# Patient Record
Sex: Male | Born: 2000 | Race: White | Hispanic: No | Marital: Single | Smoking: Current some day smoker
Health system: Southern US, Community
[De-identification: ages and names within clinical notes are randomized; demographics above are authoritative.]

## PROBLEM LIST (undated history)

## (undated) DIAGNOSIS — F3181 Bipolar II disorder: Secondary | ICD-10-CM

---

## 2019-07-11 ENCOUNTER — Emergency Department (HOSPITAL_COMMUNITY): Payer: 59

## 2019-07-11 ENCOUNTER — Encounter (HOSPITAL_COMMUNITY): Payer: Self-pay | Admitting: Emergency Medicine

## 2019-07-11 ENCOUNTER — Inpatient Hospital Stay (HOSPITAL_COMMUNITY)
Admission: EM | Admit: 2019-07-11 | Discharge: 2019-07-17 | DRG: 200 | Disposition: A | Discharge status: Home / Self Care | Payer: 59 | Attending: Student | Admitting: Student

## 2019-07-11 ENCOUNTER — Other Ambulatory Visit: Payer: Self-pay

## 2019-07-11 DIAGNOSIS — F1290 Cannabis use, unspecified, uncomplicated: Secondary | ICD-10-CM | POA: Diagnosis present

## 2019-07-11 DIAGNOSIS — R002 Palpitations: Secondary | ICD-10-CM

## 2019-07-11 DIAGNOSIS — D72829 Elevated white blood cell count, unspecified: Secondary | ICD-10-CM | POA: Diagnosis present

## 2019-07-11 DIAGNOSIS — F3181 Bipolar II disorder: Secondary | ICD-10-CM | POA: Diagnosis present

## 2019-07-11 DIAGNOSIS — J9311 Primary spontaneous pneumothorax: Secondary | ICD-10-CM | POA: Diagnosis not present

## 2019-07-11 DIAGNOSIS — F17210 Nicotine dependence, cigarettes, uncomplicated: Secondary | ICD-10-CM | POA: Diagnosis present

## 2019-07-11 DIAGNOSIS — Z23 Encounter for immunization: Secondary | ICD-10-CM

## 2019-07-11 DIAGNOSIS — Z20828 Contact with and (suspected) exposure to other viral communicable diseases: Secondary | ICD-10-CM | POA: Diagnosis present

## 2019-07-11 DIAGNOSIS — Z791 Long term (current) use of non-steroidal anti-inflammatories (NSAID): Secondary | ICD-10-CM

## 2019-07-11 DIAGNOSIS — J939 Pneumothorax, unspecified: Secondary | ICD-10-CM | POA: Diagnosis not present

## 2019-07-11 HISTORY — DX: Bipolar II disorder: F31.81

## 2019-07-11 LAB — CBC
HCT: 45.7 % (ref 39.0–52.0)
HCT: 47 % (ref 39.0–52.0)
Hemoglobin: 15.6 g/dL (ref 13.0–17.0)
Hemoglobin: 16.2 g/dL (ref 13.0–17.0)
MCH: 30.4 pg (ref 26.0–34.0)
MCH: 30.1 pg (ref 26.0–34.0)
MCHC: 34.1 g/dL (ref 30.0–36.0)
MCHC: 34.5 g/dL (ref 30.0–36.0)
MCV: 88.9 fL (ref 80.0–100.0)
MCV: 87.2 fL (ref 80.0–100.0)
Platelets: 172 10*3/uL (ref 150–400)
Platelets: 205 10*3/uL (ref 150–400)
RBC: 5.14 MIL/uL (ref 4.22–5.81)
RBC: 5.39 MIL/uL (ref 4.22–5.81)
RDW: 11.9 % (ref 11.5–15.5)
RDW: 11.9 % (ref 11.5–15.5)
WBC: 10 10*3/uL (ref 4.0–10.5)
WBC: 11.7 10*3/uL — ABNORMAL HIGH (ref 4.0–10.5)
nRBC: 0 % (ref 0.0–0.2)
nRBC: 0 % (ref 0.0–0.2)

## 2019-07-11 LAB — BASIC METABOLIC PANEL
Anion gap: 8 (ref 5–15)
BUN: 11 mg/dL (ref 6–20)
CO2: 22 mmol/L (ref 22–32)
Calcium: 9.2 mg/dL (ref 8.9–10.3)
Chloride: 107 mmol/L (ref 98–111)
Creatinine, Ser: 0.9 mg/dL (ref 0.61–1.24)
GFR calc Af Amer: >60 mL/min (ref >60)
GFR calc non Af Amer: >60 mL/min (ref >60)
Glucose, Bld: 99 mg/dL (ref 70–99)
Potassium: 4.1 mmol/L (ref 3.5–5.1)
Sodium: 137 mmol/L (ref 135–145)

## 2019-07-11 LAB — CREATININE, SERUM
Creatinine, Ser: 1.03 mg/dL (ref 0.61–1.24)
GFR calc Af Amer: >60 mL/min (ref >60)
GFR calc non Af Amer: >60 mL/min (ref >60)

## 2019-07-11 LAB — TROPONIN I (HIGH SENSITIVITY)
Troponin I (High Sensitivity): <2 ng/L (ref <18)
Troponin I (High Sensitivity): <2 ng/L (ref <18)
Troponin I (High Sensitivity): <2 ng/L (ref <18)

## 2019-07-11 LAB — SEDIMENTATION RATE: Sed Rate: 1 mm/hr (ref 0–16)

## 2019-07-11 LAB — SARS CORONAVIRUS 2 (TAT 6-24 HRS): SARS Coronavirus 2: NEGATIVE

## 2019-07-11 LAB — C-REACTIVE PROTEIN: CRP: <0.8 mg/dL (ref <1.0)

## 2019-07-11 LAB — HIV ANTIBODY (ROUTINE TESTING W REFLEX): HIV Screen 4th Generation wRfx: NONREACTIVE

## 2019-07-11 MED ORDER — MORPHINE SULFATE (PF) 2 MG/ML IV SOLN
1.0000 mg | INTRAVENOUS | Status: DC | PRN
  Administered 2019-07-11 – 2019-07-13 (×5): 1 mg via INTRAVENOUS
  Filled 2019-07-11 (×5): qty 1

## 2019-07-11 MED ORDER — ONDANSETRON HCL 4 MG/2ML IJ SOLN
4.0000 mg | Freq: Four times a day (QID) | INTRAMUSCULAR | Status: DC | PRN
  Filled 2019-07-11: qty 2

## 2019-07-11 MED ORDER — ACETAMINOPHEN 325 MG PO TABS
650.0000 mg | ORAL_TABLET | Freq: Four times a day (QID) | ORAL | Status: DC | PRN
  Administered 2019-07-11 – 2019-07-15 (×3): 650 mg via ORAL
  Filled 2019-07-11 (×3): qty 2

## 2019-07-11 MED ORDER — IBUPROFEN 600 MG PO TABS: 600.0000 mg | ORAL_TABLET | Freq: Four times a day (QID) | ORAL | 0 refills | Status: AC | PRN

## 2019-07-11 MED ORDER — INFLUENZA VAC SPLIT QUAD 0.5 ML IM SUSY
0.5000 mL | PREFILLED_SYRINGE | INTRAMUSCULAR | Status: AC
  Administered 2019-07-12: 0.5 mL via INTRAMUSCULAR
  Filled 2019-07-11: qty 0.5

## 2019-07-11 MED ORDER — MIDAZOLAM HCL 2 MG/2ML IJ SOLN
2.0000 mg | Freq: Once | INTRAMUSCULAR | Status: AC
  Administered 2019-07-11: 2 mg via INTRAVENOUS
  Filled 2019-07-11: qty 2

## 2019-07-11 MED ORDER — ENOXAPARIN SODIUM 40 MG/0.4ML ~~LOC~~ SOLN
40.0000 mg | SUBCUTANEOUS | Status: DC
  Administered 2019-07-11 – 2019-07-16 (×6): 40 mg via SUBCUTANEOUS
  Filled 2019-07-11 (×6): qty 0.4

## 2019-07-11 MED ORDER — ONDANSETRON HCL 4 MG PO TABS: 4.0000 mg | ORAL_TABLET | Freq: Four times a day (QID) | ORAL | Status: DC | PRN

## 2019-07-11 MED ORDER — SODIUM CHLORIDE 0.9% FLUSH: 3.0000 mL | Freq: Once | INTRAVENOUS | Status: DC

## 2019-07-11 MED ORDER — DEXAMETHASONE SODIUM PHOSPHATE 10 MG/ML IJ SOLN
10.0000 mg | Freq: Once | INTRAMUSCULAR | Status: DC
  Filled 2019-07-11: qty 1

## 2019-07-11 MED ORDER — ACETAMINOPHEN 650 MG RE SUPP: 650.0000 mg | Freq: Four times a day (QID) | RECTAL | Status: DC | PRN

## 2019-07-11 MED ORDER — SODIUM CHLORIDE 0.9 % IV SOLN
INTRAVENOUS | Status: DC
  Administered 2019-07-11: 21:00:00 via INTRAVENOUS

## 2019-07-11 MED ORDER — KETOROLAC TROMETHAMINE 30 MG/ML IJ SOLN
30.0000 mg | Freq: Once | INTRAMUSCULAR | Status: AC
  Administered 2019-07-11: 30 mg via INTRAVENOUS
  Filled 2019-07-11: qty 1

## 2019-07-11 NOTE — Consult Note (Signed)
NAME:  Tim Flores, MRN:  [id number], DOB:  [birth year], LOS: 0 ADMISSION DATE:  07/11/2019, CONSULTATION DATE:  07/11/2019 REFERRING MD:  Dr Rhunette CroftNAnavati of ER, CHIEF COMPLAINT:  LEft primary spontaneous pneumothorax   Brief History   See below  History of present illness   18 year old male states he woke up this morning and suddenly found him to have chest pain on the left side and maybe some mild shortness of breath.  Went to the urgent care found to have moderate-sized left pneumothorax.  Brought to: ER.  There left-sided chest tube mild pigtail catheter has been placed to drainage and suction.  With this the pneumothorax is nearly resolved.  Pulmonary has been consulted by the hospitalist and the ER team to help with to proctostomy management.  His mom is at the bedside and attest that he is adopted but his biological dad is known to be tall.  Patient routinely carries 200 pounds of weight working for a [employer] company.  He is going to start work with a [employer].  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  Denies any Valsalva maneuver.  He does admit to smoking tobacco and marijuana occasionally.  He is at moderate risk for COVID-19 given the fact he is a teenager working around.  Results are pending but he denies any Covid symptoms.  Past Medical History    has a past medical history of Bipolar 2 disorder (H[facility]).   reports that he has been smoking. He does not have any smokeless tobacco history on file.  History reviewed. No pertinent surgical history.  No Known Allergies   There is no immunization history on file for this patient.  No family history on file.   Current Facility-Administered Medications:  .  dexamethasone (DECADRON) injection 10 mg, 10 mg, Intravenous, Once, Nanavati, Ankit, MD .  sodium chloride flush (NS) 0.9 % injection 3 mL, 3 mL, Intravenous, Once, Nanavati, Ankit, MD  Current Outpatient  Medications:  .  ibuprofen (ADVIL) 600 MG tablet, Take 1 tablet (600 mg total) by mouth every 6 (six) hours as needed., Disp: 30 tablet, Rfl: 0    Significant Hospital Events   07/11/2019 -left-sided chest tube for pneumothorax  Consults:  July 11, 2019-pulmonary consult  Procedures:  July 11, 2019-left-sided tube thoracostomy  Significant Diagnostic Tests:  x  Micro Data:  x  Antimicrobials:  x  Interim history/subjective:  Seen in the ER  Objective   Blood pressure 129/77, pulse 78, temperature 98.3 F (36.8 C), temperature source Oral, resp. rate (!) 22, SpO2 97 %.       No intake or output data in the 24 hours ending 07/11/19 1730 There were no vitals filed for this visit.  Examination: General: Tall young male no distress HENT: No elevated JVP no neck nodes wearing a mask Lungs: Clear to auscultation bilaterally with equal air entry.  Left-sided chest tube present Cardiovascular: Regular rate and rhythm.  Normal heart sounds Abdomen: Soft nontender no organomegaly Extremities: No cyanosis no clubbing no edema Neuro: Alert and oriented x3 GU: Not examined  Personally visualized these films and agree with the formal findings  Dg Chest 2 View  Result Date: 07/11/2019 CLINICAL DATA:  Left-sided chest pain and shortness of breath. EXAM: CHEST - 2 VIEW COMPARISON:  None. FINDINGS: The heart size and mediastinal contours are within normal limits. Normal pulmonary vascularity. Moderate to large left pneumothorax. No significant mediastinal shift.  No consolidation or pleural effusion. No acute osseous abnormality. IMPRESSION: 1. Moderate to large left pneumothorax. Critical Value/emergent results were called by telephone at the time of interpretation on 07/11/2019 at 12:49 pm to providerANKIT NANAVATI , who verbally acknowledged these results. Electronically Signed   By: Titus Dubin M.D.   On: 07/11/2019 12:51   Dg Chest Port 1 View  Result Date: 07/11/2019  CLINICAL DATA:  Chest tube placement for pneumothorax EXAM: PORTABLE CHEST 1 VIEW COMPARISON:  July 11, 2019 study obtained earlier in the day FINDINGS: A chest tube is been placed on the left laterally. There has been near complete resolution of pneumothorax on the left with only small a pickle and apicolateral component of pneumothorax present currently. No tension component. There is subcutaneous air on the left laterally. Lungs elsewhere are clear. Heart size and pulmonary vascularity are normal. No adenopathy. No bone lesions. IMPRESSION: Chest tube placed on the left with near complete resolution of pneumothorax. Small a pickle and apicolateral pneumothorax on the left remains. There is subcutaneous air on the left laterally near the chest tube. No edema or consolidation. Cardiac silhouette within normal limits. No adenopathy. Electronically Signed   By: Lowella Grip III M.D.   On: 07/11/2019 14:39     Resolved Hospital Problem list   X  Assessment & Plan:  Left-sided spontaneous primary pneumothorax status post chest tube with near resolution  Plan -Continue tube thoracostomy to suction -Reassess without suction on July 13, 2019 and then try trial of clamping before removing the chest tube. -Check urine drug screen -Await coronavirus 19 testing  Best practice:  Per hospitalist     SIGNATURE    Dr. Brand Males, M.D., F.C.C.P,  Pulmonary and Critical Care Medicine Staff Physician, [facility] [place] Director - Interstitial Lung Disease  Program  [facility] [place], [place], [address]  Pager: [phone], If no answer or between  15:00h - 7:00h: call [phone] Telephone: [phone]  5:36 PM 07/11/2019     Labs   CBC: Recent Labs  Lab 07/11/19 1106  WBC 10.0  HGB 15.6  HCT 45.7  MCV 88.9  PLT 875    Basic Metabolic Panel: Recent Labs  Lab 07/11/19 1106  NA 137  K 4.1   CL 107  CO2 22  GLUCOSE 99  BUN 11  CREATININE 0.90  CALCIUM 9.2   GFR: CrCl cannot be calculated (Unknown ideal weight.). Recent Labs  Lab 07/11/19 1106  WBC 10.0    Liver Function Tests: No results for input(s): AST, ALT, ALKPHOS, BILITOT, PROT, ALBUMIN in the last 168 hours. No results for input(s): LIPASE, AMYLASE in the last 168 hours. No results for input(s): AMMONIA in the last 168 hours.  ABG No results found for: PHART, PCO2ART, PO2ART, HCO3, TCO2, ACIDBASEDEF, O2SAT   Coagulation Profile: No results for input(s): INR, PROTIME in the last 168 hours.  Cardiac Enzymes: No results for input(s): CKTOTAL, CKMB, CKMBINDEX, TROPONINI in the last 168 hours.  HbA1C: No results found for: HGBA1C  CBG: No results for input(s): GLUCAP in the last 168 hours.  Review of Systems:   11 point review of system as per HPI otherwise negative  Past Medical History  He,  has a past medical history of Bipolar 2 disorder ([place]).   Surgical History   History reviewed. No pertinent surgical history.   Social History   reports that he has been  smoking. He does not have any smokeless tobacco history on file. He reports previous alcohol use. He reports current drug use. Drug: Marijuana.   Family History   His family history is not on file.   Allergies No Known Allergies   Home Medications  Prior to Admission medications   Medication Sig Start Date End Date Taking? Authorizing Provider  ibuprofen (ADVIL) 600 MG tablet Take 1 tablet (600 mg total) by mouth every 6 (six) hours as needed. 07/11/19   Derwood Kaplan, MD

## 2019-07-11 NOTE — H&P (Signed)
TRH H&P    Patient Demographics:    Tim Flores, is a 18 y.o. male  MRN: [id number]  DOB - December 31, 2000  Admit Date - 07/11/2019  Referring MD/NP/PA: Dr. Kathrynn Humble  Outpatient Primary MD for the patient is Patient, No Pcp Per  Patient coming from: Home  Chief complaint-chest pain   HPI:    Tim Flores  is a 18 y.o. male, with history of bipolar disorder came with complaints of left-sided chest pain and palpitations which started this morning.  Patient says that symptoms started after he woke up the pain was left-sided throbbing with palpitations.  He was found to have abnormal EKG, with diffuse ST elevation.  In the ED patient was found to have moderate to large left-sided pneumothorax.  Chest tube was inserted.  Repeat x-ray shows resolution of pneumothorax with chest tube in place.  Pulmonology was consulted. Patient also told ED provider about 2-3 loose BMs so he has been placed under PUI to rule out COVID-19. He denies nausea and vomiting. He smokes 4 to 5 cigarettes a day. He denies drinking alcohol.    Review of systems:    In addition to the HPI above,   All other systems reviewed and are negative.    Past History of the following :    Past Medical History:  Diagnosis Date  . Bipolar 2 disorder ([place])       History reviewed. No pertinent surgical history.    Social History:   e   Social History   Tobacco Use  . Smoking status: Current Some Day Smoker  Substance Use Topics  . Alcohol use: Not Currently    Frequency: Never       Family History :   No family history of cancer or heart disease   Home Medications:   Prior to Admission medications   Medication Sig Start Date End Date Taking? Authorizing Provider  ibuprofen (ADVIL) 600 MG tablet Take 1 tablet (600 mg total) by mouth every 6 (six) hours as needed. 07/11/19   Varney Biles, MD     Allergies:    No Known  Allergies   Physical Exam:   Vitals  Blood pressure 129/72, pulse (!) 46, temperature 98.3 F (36.8 C), temperature source Oral, resp. rate 15, SpO2 99 %.  1.  General: Appears in no acute distress  2. Psychiatric: Alert, oriented x3, intact insight and judgment  3. Neurologic: Cranial nerves II through XII grossly intact, moving all extremities  4. HEENMT:  Atraumatic normocephalic, extraocular muscles are intact  5. Respiratory : Clear to auscultation bilaterally, no wheezing or crackles  6. Cardiovascular : S1-S2, regular, no murmur auscultated  7. Gastrointestinal:  Abdomen is soft, nontender, no organomegaly      Data Review:    CBC Recent Labs  Lab 07/11/19 1106  WBC 10.0  HGB 15.6  HCT 45.7  PLT 172  MCV 88.9  MCH 30.4  MCHC 34.1  RDW 11.9   ------------------------------------------------------------------------------------------------------------------  Results for orders placed or performed during the hospital encounter of 07/11/19 (from  the past 48 hour(s))  Basic metabolic panel     Status: None   Collection Time: 07/11/19 11:06 AM  Result Value Ref Range   Sodium 137 135 - 145 mmol/L   Potassium 4.1 3.5 - 5.1 mmol/L   Chloride 107 98 - 111 mmol/L   CO2 22 22 - 32 mmol/L   Glucose, Bld 99 70 - 99 mg/dL   BUN 11 6 - 20 mg/dL   Creatinine, Ser 0.90 0.61 - 1.24 mg/dL   Calcium 9.2 8.9 - 10.3 mg/dL   GFR calc non Af Amer >60 >60 mL/min   GFR calc Af Amer >60 >60 mL/min   Anion gap 8 5 - 15    Comment: Performed at [facility], [place] [address], [place] [address]  CBC     Status: None   Collection Time: 07/11/19 11:06 AM  Result Value Ref Range   WBC 10.0 4.0 - 10.5 K/uL   RBC 5.14 4.22 - 5.81 MIL/uL   Hemoglobin 15.6 13.0 - 17.0 g/dL   HCT 45.7 39.0 - 52.0 %   MCV 88.9 80.0 - 100.0 fL   MCH 30.4 26.0 - 34.0 pg   MCHC 34.1 30.0 - 36.0 g/dL   RDW 11.9 11.5 - 15.5 %   Platelets 172 150 - 400 K/uL   nRBC 0.0 0.0 - 0.2 %     Comment: Performed at [facility], [place] [address] [place], [place] [address]  Troponin I (High Sensitivity)     Status: None   Collection Time: 07/11/19 11:06 AM  Result Value Ref Range   Troponin I (High Sensitivity) <2 <18 ng/L    Comment: (NOTE) Elevated high sensitivity troponin I (hsTnI) values and significant  changes across serial measurements may suggest ACS but many other  chronic and acute conditions are known to elevate hsTnI results.  Refer to the Links section for chest pain algorithms and additional  guidance. Performed at [facility], [place] [address] [place], [place] [address]     Chemistries  Recent Labs  Lab 07/11/19 1106  NA 137  K 4.1  CL 107  CO2 22  GLUCOSE 99  BUN 11  CREATININE 0.90  CALCIUM 9.2   -- Urine analysis: No results found for: COLORURINE, APPEARANCEUR, LABSPEC, PHURINE, GLUCOSEU, HGBUR, BILIRUBINUR, KETONESUR, PROTEINUR, UROBILINOGEN, NITRITE, LEUKOCYTESUR    Imaging Results:    Dg Chest 2 View  Result Date: 07/11/2019 CLINICAL DATA:  Left-sided chest pain and shortness of breath. EXAM: CHEST - 2 VIEW COMPARISON:  None. FINDINGS: The heart size and mediastinal contours are within normal limits. Normal pulmonary vascularity. Moderate to large left pneumothorax. No significant mediastinal shift. No consolidation or pleural effusion. No acute osseous abnormality. IMPRESSION: 1. Moderate to large left pneumothorax. Critical Value/emergent results were called by telephone at the time of interpretation on 07/11/2019 at 12:49 pm to providerANKIT NANAVATI , who verbally acknowledged these results. Electronically Signed   By: Titus Dubin M.D.   On: 07/11/2019 12:51   Dg Chest Port 1 View  Result Date: 07/11/2019 CLINICAL DATA:  Chest tube placement for pneumothorax EXAM: PORTABLE CHEST 1 VIEW COMPARISON:  July 11, 2019 study obtained earlier in the day FINDINGS: A chest tube is been placed on the left laterally. There has  been near complete resolution of pneumothorax on the left with only small a pickle and apicolateral component of pneumothorax present currently. No tension component. There is subcutaneous air on the left laterally.  Lungs elsewhere are clear. Heart size and pulmonary vascularity are normal. No adenopathy. No bone lesions. IMPRESSION: Chest tube placed on the left with near complete resolution of pneumothorax. Small a pickle and apicolateral pneumothorax on the left remains. There is subcutaneous air on the left laterally near the chest tube. No edema or consolidation. Cardiac silhouette within normal limits. No adenopathy. Electronically Signed   By: Lowella Grip III M.D.   On: 07/11/2019 14:39    My personal review of EKG: Rhythm NSR, diffuse ST elevation noted in almost all leads.   Assessment & Plan:    Active Problems:   Pneumothorax   1. Pneumothorax-patient presented with left-sided pneumothorax, status post chest tube placement.  Chest x-ray after chest tube placement shows near complete resolution of pneumothorax.  Continue with chest tube, will obtain chest x-ray in a.m.  Pulmonology has been consulted.  Start morphine 1 mg every 4 hours as needed for pain.  2. ?  Pericarditis-patient has diffuse ST elevation noted on the EKG.  Also came with left-sided chest pain though he had pneumothorax as above.  Concern for myopericarditis with COVID-19.  Patient will be admitted for PUI to rule out COVID-19.  Repeat EKG in a.m.  Will cycle troponin x2.  Obtain echocardiogram, ESR, CRP, HIV.  Follow echocardiogram results.    DVT Prophylaxis-   Lovenox   AM Labs Ordered, also please review Full Orders  Family Communication: Admission, patients condition and plan of care including tests being ordered have been discussed with the patient who indicate understanding and agree with the plan and Code Status.  Code Status: Full code  Admission status: Observation: Based on patients clinical  presentation and evaluation of above clinical data, I have made determination that patient meets Inpatient criteria at this time.  Time spent in minutes : 60 minutes   Oswald Hillock M.D on 07/11/2019 at 3:16 PM

## 2019-07-11 NOTE — ED Provider Notes (Addendum)
[name] Provider Note   CSN: [id number] Arrival date & time: 07/11/19  1053     History   Chief Complaint Chief Complaint  Patient presents with   Chest Pain    HPI Tim Flores is a 18 y.o. male.     HPI  18 year old with history of bipolar disorder comes with a chief complaint of chest pain, palpitations.  He reports that he started having symptoms around 9:00 after he woke up.  The pain initially was excruciating and described as left-sided throbbing pain with palpitations.  He eventually admitted to the urgent care where the noticed abnormal EKG and sent him to the ER.  Patient states that his pain has improved significantly but at no point has it resolved.  His pain is worse with deep inspiration.  He does not think the pain is positional or exertional.  No history of similar pain.  He reports regular marijuana use.  Review of system is also positive for diarrheal illness for the last 1 week.  He is having 2-3 loose bowel once every day.  He denies any sick exposures.  No chest pain, cough.  Past Medical History:  Diagnosis Date   Bipolar 2 disorder (HCC)     There are no active problems to display for this patient.   History reviewed. No pertinent surgical history.      Home Medications    Prior to Admission medications   Medication Sig Start Date End Date Taking? Authorizing Provider  ibuprofen (ADVIL) 600 MG tablet Take 1 tablet (600 mg total) by mouth every 6 (six) hours as needed. 07/11/19   Derwood KaplanNanavati, Tichina Koebel, MD    Family History No family history on file.  Social History Social History   Tobacco Use   Smoking status: Current Some Day Smoker  Substance Use Topics   Alcohol use: Not Currently    Frequency: Never   Drug use: Yes    Types: Marijuana     Allergies   Patient has no known allergies.   Review of Systems Review of Systems  Constitutional: Positive for activity change.  Respiratory:  Positive for shortness of breath. Negative for cough.   Cardiovascular: Positive for chest pain.  Gastrointestinal: Positive for diarrhea.  Allergic/Immunologic: Negative for immunocompromised state.  All other systems reviewed and are negative.    Physical Exam Updated Vital Signs BP 129/60    Pulse 65    Temp 98.3 F (36.8 C) (Oral)    Resp 20    SpO2 98%   Physical Exam Vitals signs and nursing note reviewed.  Constitutional:      Appearance: He is well-developed.  HENT:     Head: Atraumatic.  Neck:     Musculoskeletal: Neck supple.  Cardiovascular:     Rate and Rhythm: Normal rate.     Heart sounds: Normal heart sounds. No murmur. No friction rub.  Pulmonary:     Effort: Pulmonary effort is normal.  Musculoskeletal:     Right lower leg: No edema.     Left lower leg: No edema.  Skin:    General: Skin is warm.  Neurological:     Mental Status: He is alert and oriented to person, place, and time.      ED Treatments / Results  Labs (all labs ordered are listed, but only abnormal results are displayed) Labs Reviewed  SARS CORONAVIRUS 2 (TAT 6-24 HRS)  BASIC METABOLIC PANEL  CBC  TROPONIN I (HIGH SENSITIVITY)  TROPONIN  I (HIGH SENSITIVITY)    EKG EKG Interpretation  Date/Time:  Saturday July 11 2019 10:58:17 EDT Ventricular Rate:  52 PR Interval:  118 QRS Duration: 100 QT Interval:  408 QTC Calculation: 379 R Axis:   81 Text Interpretation:  Sinus bradycardia with sinus arrhythmia ST elevation, consider early repolarization, pericarditis, or injury Abnormal ECG No old tracing to compare Confirmed by Varney Biles [phone]) on 07/11/2019 11:37:54 AM   Radiology Dg Chest 2 View  Result Date: 07/11/2019 CLINICAL DATA:  Left-sided chest pain and shortness of breath. EXAM: CHEST - 2 VIEW COMPARISON:  None. FINDINGS: The heart size and mediastinal contours are within normal limits. Normal pulmonary vascularity. Moderate to large left pneumothorax. No  significant mediastinal shift. No consolidation or pleural effusion. No acute osseous abnormality. IMPRESSION: 1. Moderate to large left pneumothorax. Critical Value/emergent results were called by telephone at the time of interpretation on 07/11/2019 at 12:49 pm to providerANKIT Armeda Plumb , who verbally acknowledged these results. Electronically Signed   By: Titus Dubin M.D.   On: 07/11/2019 12:51    Procedures .Critical Care Performed by: Varney Biles, MD Authorized by: Varney Biles, MD   Critical care provider statement:    Critical care time (minutes):  33   Critical care was necessary to treat or prevent imminent or life-threatening deterioration of the following conditions:  Respiratory failure   Critical care was time spent personally by me on the following activities:  Discussions with consultants, evaluation of patient's response to treatment, examination of patient, ordering and performing treatments and interventions, ordering and review of laboratory studies, ordering and review of radiographic studies, pulse oximetry, re-evaluation of patient's condition, obtaining history from patient or surrogate and review of old charts CHEST TUBE INSERTION  Date/Time: 07/11/2019 1:56 PM Performed by: Varney Biles, MD Authorized by: Varney Biles, MD   Consent:    Consent obtained:  Written   Consent given by:  Patient   Risks discussed:  Bleeding, incomplete drainage, nerve damage, damage to surrounding structures, infection and pain   Alternatives discussed:  No treatment Universal protocol:    Procedure explained and questions answered to patient or proxy's satisfaction: yes     Relevant documents present and verified: yes     Test results available and properly labeled: yes     Imaging studies available: yes     Required blood products, implants, devices, and special equipment available: yes     Site/side marked: yes     Immediately prior to procedure a time out was  called: yes     Patient identity confirmed:  Arm band Pre-procedure details:    Skin preparation:  ChloraPrep   Preparation: Patient was prepped and draped in the usual sterile fashion   Sedation:    Sedation type:  Anxiolysis Anesthesia (see MAR for exact dosages):    Anesthesia method:  Local infiltration   Local anesthetic:  Lidocaine 1% WITH epi Procedure details:    Placement location:  L lateral   Scalpel size:  10   Tube size (Fr):  16   Ultrasound guidance: no     Tension pneumothorax: no     Tube connected to:  Suction   Drainage characteristics:  Air only   Suture material:  0 silk   Dressing:  4x4 sterile gauze Post-procedure details:    Post-insertion x-ray findings: tube in good position     Patient tolerance of procedure:  Tolerated well, no immediate complications   (including critical care time)  Medications  Ordered in ED Medications  sodium chloride flush (NS) 0.9 % injection 3 mL (3 mLs Intravenous Not Given 07/11/19 1156)  dexamethasone (DECADRON) injection 10 mg (has no administration in time range)  midazolam (VERSED) injection 2 mg (has no administration in time range)     Initial Impression / Assessment and Plan / ED Course  I have reviewed the triage vital signs and the nursing notes.  Pertinent labs & imaging results that were available during my care of the patient were reviewed by me and considered in my medical decision making (see chart for details).  Clinical Course as of Jul 10 1301  Sat Jul 11, 2019  1240 ACS is not high in the differential diagnosis.  Repeat troponin will not be ordered.  Symptoms have been ongoing for 3 hours at the time of draw, therefore if there was to be myocardial injury troponin would have been detected by now.  Troponin I (High Sensitivity): <2 [AN]  1301 Chest x-ray shows large left-sided pneumothorax Patient has been consented.  Results of the x-ray discussed with him.  We will proceed with tube thoracostomy for  primary spontaneous pneumothorax.  DG Chest 2 View [AN]    Clinical Course User Index [AN] Derwood Kaplan, MD       18 year old comes in a chief complaint of abnormal EKG and chest pain.  Chest pain started at 9:00 and has been constant.  He denies any exertional component but there is pleuritic component to it.  Differential diagnosis includes pericarditis, myocarditis, tachydysrhythmia like PSVT.  It is possible that marijuana could have been laced.  We will get a UDS along with basic labs.  Of note, patient also reports diarrheal illness for 1 week.  COVID-19 is known to cause diarrhea and also myopericarditis.  His EKG is showing likely pericarditis so we will get an outpatient Covid test as well.   PUI instructions discussed.  Patient lives with a roommate.  He will remain isolated until his test results come back.  Tim Flores was evaluated in Emergency Department on 07/11/2019 for the symptoms described in the history of present illness. He was evaluated in the context of the global COVID-19 pandemic, which necessitated consideration that the patient might be at risk for infection with the SARS-CoV-2 virus that causes COVID-19. Institutional protocols and algorithms that pertain to the evaluation of patients at risk for COVID-19 are in a state of rapid change based on information released by regulatory bodies including the CDC and federal and state organizations. These policies and algorithms were followed during the patient's care in the ED.    Final Clinical Impressions(s) / ED Diagnoses   Final diagnoses:  Palpitations  Primary spontaneous pneumothorax    ED Discharge Orders         Ordered    ibuprofen (ADVIL) 600 MG tablet  Every 6 hours PRN     07/11/19 1239           Derwood Kaplan, MD 07/11/19 1241    Derwood Kaplan, MD 07/11/19 1302    Derwood Kaplan, MD 07/11/19 1357

## 2019-07-11 NOTE — ED Triage Notes (Signed)
Pt arrives via [facility] from home with c/o sharp and pressure like chest pain with palpitations that began yesterday, symptoms increased today. Pt seen at [facility] and was found to be in and out of an irregular rhythm on 12 lead there with some ST elevation that was transmitted to EDP here pta. Pt received 324mg  asa, pt endorses hx of marijuana use but denies any other illicit drugs. A/ox4. No known cardiac history.  Vss: 144/44, HR 40-70s, 99% on RA, CBG 114.

## 2019-07-11 NOTE — Plan of Care (Signed)

## 2019-07-11 NOTE — Discharge Instructions (Addendum)
Pneumothorax °A pneumothorax is commonly called a collapsed lung. It is a condition in which air leaks from a lung and builds up between the thin layer of tissue that covers the lungs (visceral pleura) and the interior wall of the chest cavity (parietal pleura). The air gets trapped outside the lung, between the lung and the chest wall (pleural space). The air takes up space and prevents the lung from fully expanding. °This condition sometimes occurs suddenly with no apparent cause. The buildup of air may be small or large. A small pneumothorax may go away on its own. A large pneumothorax will require treatment and hospitalization. °What are the causes? °This condition may be caused by: °· Trauma and injury to the chest wall. °· Surgery and other medical procedures. °· A complication of an underlying lung problem, especially chronic obstructive pulmonary disease (COPD) or emphysema. °Sometimes the cause of this condition is not known. °What increases the risk? °You are more likely to develop this condition if: °· You have an underlying lung problem. °· You smoke. °· You are 20-40 years old, male, tall, and underweight. °· You have a personal or family history of pneumothorax. °· You have an eating disorder (anorexia nervosa). °This condition can also happen quickly, even in people with no history of lung problems. °What are the signs or symptoms? °Sometimes a pneumothorax will have no symptoms. When symptoms are present, they can include: °· Chest pain. °· Shortness of breath. °· Increased rate of breathing. °· Bluish color to your lips or skin (cyanosis). °How is this diagnosed? °This condition may be diagnosed by: °· A medical history and physical exam. °· A chest X-ray, chest CT scan, or ultrasound. °How is this treated? °Treatment depends on how severe your condition is. The goal of treatment is to remove the extra air and allow your lung to expand back to its normal size. °· For a small pneumothorax: °? No  treatment may be needed. °? Extra oxygen is sometimes used to make it go away more quickly. °· For a large pneumothorax or a pneumothorax that is causing symptoms, a procedure is done to drain the air from your lungs. To do this, a health care provider may use: °? A needle with a syringe. This is used to suck air from a pleural space where no additional leakage is taking place. °? A chest tube. This is used to suck air where there is ongoing leakage into the pleural space. The chest tube may need to remain in place for several days until the air leak has healed. °· In more severe cases, surgery may be needed to repair the damage that is causing the leak. °· If you have multiple pneumothorax episodes or have an air leak that will not heal, a procedure called a pleurodesis may be done. A medicine is placed in the pleural space to irritate the tissues around the lung so that the lung will stick to the chest wall, seal any leaks, and stop any buildup of air in that space. °If you have an underlying lung problem, severe symptoms, or a large pneumothorax you will usually need to stay in the hospital. °Follow these instructions at home: °Lifestyle °· Do not use any products that contain nicotine or tobacco, such as cigarettes and e-cigarettes. These are major risk factors in pneumothorax. If you need help quitting, ask your health care provider. °· Do not lift anything that is heavier than 10 lb (4.5 kg), or the limit that your health care   provider tells you, until he or she says that it is safe. °· Avoid activities that take a lot of effort (strenuous) for as long as told by your health care provider. °· Return to your normal activities as told by your health care provider. Ask your health care provider what activities are safe for you. °· Do not fly in an airplane or scuba dive until your health care provider says it is okay. °General instructions °· Take over-the-counter and prescription medicines only as told by your  health care provider. °· If a cough or pain makes it difficult for you to sleep at night, try sleeping in a semi-upright position in a recliner or by using 2 or 3 pillows. °· If you had a chest tube and it was removed, ask your health care provider when you can remove the bandage (dressing). While the dressing is in place, do not allow it to get wet. °· Keep all follow-up visits as told by your health care provider. This is important. °Contact a health care provider if: °· You cough up thick mucus (sputum) that is yellow or green in color. °· You were treated with a chest tube, and you have redness, increasing pain, or discharge at the site where it was placed. °Get help right away if: °· You have increasing chest pain or shortness of breath. °· You have a cough that will not go away. °· You begin coughing up blood. °· You have pain that is getting worse or is not controlled with medicines. °· The site where your chest tube was located opens up. °· You feel air coming out of the site where the chest tube was placed. °· You have a fever or persistent symptoms for more than 2-3 days. °· You have a fever and your symptoms suddenly get worse. °These symptoms may represent a serious problem that is an emergency. Do not wait to see if the symptoms will go away. Get medical help right away. Call your local emergency services (911 in the [place] Do not drive yourself to the hospital. °Summary °· A pneumothorax, commonly called a collapsed lung, is a condition in which air leaks from a lung and gets trapped between the lung and the chest wall (pleural space). °· The buildup of air may be small or large. A small pneumothorax may go away on its own. A large pneumothorax will require treatment and hospitalization. °· Treatment for this condition depends on how severe the pneumothorax is. The goal of treatment is to remove the extra air and allow the lung to expand back to its normal size. °This information is not intended to  replace advice given to you by your health care provider. Make sure you discuss any questions you have with your health care provider. °Document Released: 09/03/2005 Document Revised: 08/16/2017 Document Reviewed: 08/12/2017 °Elsevier Patient Education © 2020 [employer] ° °

## 2019-07-12 ENCOUNTER — Observation Stay (HOSPITAL_COMMUNITY): Payer: 59

## 2019-07-12 DIAGNOSIS — F1990 Other psychoactive substance use, unspecified, uncomplicated: Secondary | ICD-10-CM | POA: Diagnosis not present

## 2019-07-12 DIAGNOSIS — F1920 Other psychoactive substance dependence, uncomplicated: Secondary | ICD-10-CM | POA: Diagnosis not present

## 2019-07-12 DIAGNOSIS — F17200 Nicotine dependence, unspecified, uncomplicated: Secondary | ICD-10-CM | POA: Diagnosis not present

## 2019-07-12 DIAGNOSIS — D72829 Elevated white blood cell count, unspecified: Secondary | ICD-10-CM | POA: Diagnosis present

## 2019-07-12 DIAGNOSIS — F1290 Cannabis use, unspecified, uncomplicated: Secondary | ICD-10-CM | POA: Diagnosis present

## 2019-07-12 DIAGNOSIS — F17210 Nicotine dependence, cigarettes, uncomplicated: Secondary | ICD-10-CM | POA: Diagnosis present

## 2019-07-12 DIAGNOSIS — Z23 Encounter for immunization: Secondary | ICD-10-CM | POA: Diagnosis not present

## 2019-07-12 DIAGNOSIS — J9311 Primary spontaneous pneumothorax: Secondary | ICD-10-CM | POA: Diagnosis present

## 2019-07-12 DIAGNOSIS — Z791 Long term (current) use of non-steroidal anti-inflammatories (NSAID): Secondary | ICD-10-CM | POA: Diagnosis not present

## 2019-07-12 DIAGNOSIS — Z720 Tobacco use: Secondary | ICD-10-CM | POA: Diagnosis not present

## 2019-07-12 DIAGNOSIS — I313 Pericardial effusion (noninflammatory): Secondary | ICD-10-CM

## 2019-07-12 DIAGNOSIS — Z20828 Contact with and (suspected) exposure to other viral communicable diseases: Secondary | ICD-10-CM | POA: Diagnosis present

## 2019-07-12 DIAGNOSIS — F3181 Bipolar II disorder: Secondary | ICD-10-CM | POA: Diagnosis present

## 2019-07-12 DIAGNOSIS — F1120 Opioid dependence, uncomplicated: Secondary | ICD-10-CM | POA: Diagnosis not present

## 2019-07-12 DIAGNOSIS — R002 Palpitations: Secondary | ICD-10-CM | POA: Diagnosis present

## 2019-07-12 LAB — COMPREHENSIVE METABOLIC PANEL
ALT: 13 U/L (ref 0–44)
AST: 13 U/L — ABNORMAL LOW (ref 15–41)
Albumin: 4 g/dL (ref 3.5–5.0)
Alkaline Phosphatase: 55 U/L (ref 38–126)
Anion gap: 10 (ref 5–15)
BUN: 12 mg/dL (ref 6–20)
CO2: 23 mmol/L (ref 22–32)
Calcium: 9.5 mg/dL (ref 8.9–10.3)
Chloride: 107 mmol/L (ref 98–111)
Creatinine, Ser: 0.86 mg/dL (ref 0.61–1.24)
GFR calc Af Amer: >60 mL/min (ref >60)
GFR calc non Af Amer: >60 mL/min (ref >60)
Glucose, Bld: 87 mg/dL (ref 70–99)
Potassium: 3.9 mmol/L (ref 3.5–5.1)
Sodium: 140 mmol/L (ref 135–145)
Total Bilirubin: 2.8 mg/dL — ABNORMAL HIGH (ref 0.3–1.2)
Total Protein: 6.3 g/dL — ABNORMAL LOW (ref 6.5–8.1)

## 2019-07-12 LAB — RAPID URINE DRUG SCREEN, HOSP PERFORMED
Amphetamines: NOT DETECTED
Barbiturates: NOT DETECTED
Benzodiazepines: POSITIVE — AB
Cocaine: NOT DETECTED
Opiates: POSITIVE — AB
Tetrahydrocannabinol: POSITIVE — AB

## 2019-07-12 LAB — ECHOCARDIOGRAM COMPLETE
Height: 74 in
Weight: 2400 oz

## 2019-07-12 LAB — CBC
HCT: 44.4 % (ref 39.0–52.0)
Hemoglobin: 15.6 g/dL (ref 13.0–17.0)
MCH: 30.8 pg (ref 26.0–34.0)
MCHC: 35.1 g/dL (ref 30.0–36.0)
MCV: 87.7 fL (ref 80.0–100.0)
Platelets: 186 10*3/uL (ref 150–400)
RBC: 5.06 MIL/uL (ref 4.22–5.81)
RDW: 11.9 % (ref 11.5–15.5)
WBC: 8.7 10*3/uL (ref 4.0–10.5)
nRBC: 0 % (ref 0.0–0.2)

## 2019-07-12 LAB — TROPONIN I (HIGH SENSITIVITY): Troponin I (High Sensitivity): 4 ng/L (ref <18)

## 2019-07-12 LAB — SARS CORONAVIRUS 2 (TAT 6-24 HRS): SARS Coronavirus 2: NEGATIVE

## 2019-07-12 NOTE — Progress Notes (Addendum)
Chest x-ray still revealing pneumothorax   Will place chest tube back to suction Follow-up on chest x-ray in a.m.

## 2019-07-12 NOTE — Progress Notes (Signed)
  Echocardiogram 2D Echocardiogram has been performed.  Tim Flores 07/12/2019, 2:40 PM

## 2019-07-12 NOTE — Progress Notes (Signed)
Patient ID: Tim CruelMatthew Silveira, male   DOB: [birth year], 18 y.o.   MRN: [id number]  PROGRESS NOTE    Tim Flores  WUJ:[id number] DOB: [birth year] DOA: 07/11/2019 PCP: Patient, No Pcp Per   Brief Narrative:  18-year-old male with history of bipolar disorder presented on 07/11/2019 with left-sided chest pain and palpitations.  He was found to have abnormal EKG with diffuse ST elevation along with moderate to large left-sided pneumothorax.  Chest tube was inserted.  Pulmonary was consulted.  Assessment & Plan:   Spontaneous left-sided pneumothorax -Status post left-sided chest tube placement resulting in much improvement. -Pulmonary following and managing chest.  Follow chest x-rays -COVID-19 testing negative  Abnormal EKG: Resolved -Initial EKG showed question of diffuse ST elevation with concern for Covid myopericarditis -This morning's EKG only showed sinus bradycardia. -No current chest pains.  Leukocytosis -Probably reactive.  Resolved.   DVT prophylaxis: Lovenox Code Status: Full Family Communication: Spoke to patient at bedside.  No family at bedside. Disposition Plan: Home in 1 to 2 days once cleared by pulmonary  Consultants: Pulmonary  Procedures: Chest tube placement  Antimicrobials: None   Subjective: Patient seen and examined at bedside.  He denies any current chest pain.  Only some soreness around the chest tube site.  No difficulty breathing.  No cough.  No overnight fevers.  Objective: Vitals:   07/12/19 0000 07/12/19 0500 07/12/19 0508 07/12/19 0813  BP: 111/66 105/64  127/62  Pulse:    63  Resp: 18   18  Temp: 98.6 F (37 C)  98.2 F (36.8 C) 98 F (36.7 C)  TempSrc: Oral  Oral Oral  SpO2: 99% 100%  99%  Weight:      Height:        Intake/Output Summary (Last 24 hours) at 07/12/2019 0959 Last data filed at 07/11/2019 2100 Gross per 24 hour  Intake 3.83 ml  Output -  Net 3.83 ml   Filed Weights   07/11/19 2046  Weight: 68 kg    Examination:  General exam: Appears calm and comfortable  Respiratory system: Bilateral decreased breath sounds at bases with left-sided chest tube present Cardiovascular system: S1 & S2 heard, Rate controlled Gastrointestinal system: Abdomen is nondistended, soft and nontender. Normal bowel sounds heard. Extremities: No cyanosis, clubbing, edema    Data Reviewed: I have personally reviewed following labs and imaging studies  CBC: Recent Labs  Lab 07/11/19 1106 07/11/19 2025 07/12/19 0502  WBC 10.0 11.7* 8.7  HGB 15.6 16.2 15.6  HCT 45.7 47.0 44.4  MCV 88.9 87.2 87.7  PLT 172 205 186   Basic Metabolic Panel: Recent Labs  Lab 07/11/19 1106 07/11/19 2025 07/12/19 0502  NA 137  --  140  K 4.1  --  3.9  CL 107  --  107  CO2 22  --  23  GLUCOSE 99  --  87  BUN 11  --  12  CREATININE 0.90 1.03 0.86  CALCIUM 9.2  --  9.5   GFR: Estimated Creatinine Clearance: 132.9 mL/min (by C-G formula based on SCr of 0.86 mg/dL). Liver Function Tests: Recent Labs  Lab 07/12/19 0502  AST 13*  ALT 13  ALKPHOS 55  BILITOT 2.8*  PROT 6.3*  ALBUMIN 4.0   No results for input(s): LIPASE, AMYLASE in the last 168 hours. No results for input(s): AMMONIA in the last 168 hours. Coagulation Profile: No results for input(s): INR, PROTIME in the last 168 hours. Cardiac Enzymes: No results for input(s): CKTOTAL, CKMB, CKMBINDEX,  TROPONINI in the last 168 hours. BNP (last 3 results) No results for input(s): PROBNP in the last 8760 hours. HbA1C: No results for input(s): HGBA1C in the last 72 hours. CBG: No results for input(s): GLUCAP in the last 168 hours. Lipid Profile: No results for input(s): CHOL, HDL, LDLCALC, TRIG, CHOLHDL, LDLDIRECT in the last 72 hours. Thyroid Function Tests: No results for input(s): TSH, T4TOTAL, FREET4, T3FREE, THYROIDAB in the last 72 hours. Anemia Panel: No results for input(s): VITAMINB12, FOLATE, FERRITIN, TIBC, IRON, RETICCTPCT in the last 72 hours. Sepsis Labs: No  results for input(s): PROCALCITON, LATICACIDVEN in the last 168 hours.  Recent Results (from the past 240 hour(s))  SARS CORONAVIRUS 2 (TAT 6-24 HRS) Nasopharyngeal Nasopharyngeal Swab     Status: None   Collection Time: 07/11/19  1:01 PM   Specimen: Nasopharyngeal Swab  Result Value Ref Range Status   SARS Coronavirus 2 NEGATIVE NEGATIVE Final    Comment: (NOTE) SARS-CoV-2 target nucleic acids are NOT DETECTED. The SARS-CoV-2 RNA is generally detectable in upper and lower respiratory specimens during the acute phase of infection. Negative results do not preclude SARS-CoV-2 infection, do not rule out co-infections with other pathogens, and should not be used as the sole basis for treatment or other patient management decisions. Negative results must be combined with clinical observations, patient history, and epidemiological information. The expected result is Negative. Fact Sheet for Patients: HairSlick.no Fact Sheet for Healthcare Providers: quierodirigir.com This test is not yet approved or cleared by the [place] FDA and  has been authorized for detection and/or diagnosis of SARS-CoV-2 by FDA under an Emergency Use Authorization (EUA). This EUA will remain  in effect (meaning this test can be used) for the duration of the COVID-19 declaration under Section 56 4(b)(1) of the Act, 21 U.S.C. section 360bbb-3(b)(1), unless the authorization is terminated or revoked sooner. Performed at [facility], [address] [place], [place] [address]   SARS CORONAVIRUS 2 (TAT 6-24 HRS) Nasopharyngeal Nasopharyngeal Swab     Status: None   Collection Time: 07/11/19 11:51 PM   Specimen: Nasopharyngeal Swab  Result Value Ref Range Status   SARS Coronavirus 2 NEGATIVE NEGATIVE Final    Comment: (NOTE) SARS-CoV-2 target nucleic acids are NOT DETECTED. The SARS-CoV-2 RNA is generally detectable in upper and lower respiratory  specimens during the acute phase of infection. Negative results do not preclude SARS-CoV-2 infection, do not rule out co-infections with other pathogens, and should not be used as the sole basis for treatment or other patient management decisions. Negative results must be combined with clinical observations, patient history, and epidemiological information. The expected result is Negative. Fact Sheet for Patients: HairSlick.no Fact Sheet for Healthcare Providers: quierodirigir.com This test is not yet approved or cleared by the [place] FDA and  has been authorized for detection and/or diagnosis of SARS-CoV-2 by FDA under an Emergency Use Authorization (EUA). This EUA will remain  in effect (meaning this test can be used) for the duration of the COVID-19 declaration under Section 56 4(b)(1) of the Act, 21 U.S.C. section 360bbb-3(b)(1), unless the authorization is terminated or revoked sooner. Performed at [facility], [address], [place], [place] [address]          Radiology Studies: Dg Chest 2 View  Result Date: 07/11/2019 CLINICAL DATA:  Left-sided chest pain and shortness of breath. EXAM: CHEST - 2 VIEW COMPARISON:  None. FINDINGS: The heart size and mediastinal contours are within normal limits. Normal pulmonary  vascularity. Moderate to large left pneumothorax. No significant mediastinal shift. No consolidation or pleural effusion. No acute osseous abnormality. IMPRESSION: 1. Moderate to large left pneumothorax. Critical Value/emergent results were called by telephone at the time of interpretation on 07/11/2019 at 12:49 pm to providerANKIT NANAVATI , who verbally acknowledged these results. Electronically Signed   By: Titus Dubin M.D.   On: 07/11/2019 12:51   Dg Chest Port 1 View  Result Date: 07/12/2019 CLINICAL DATA:  Left-sided pneumothorax EXAM: PORTABLE CHEST 1 VIEW COMPARISON:  July 11, 2019  FINDINGS: Chest tube remains on the left laterally. Left apical pneumothorax is slightly larger without tension component. There is less soft tissue air laterally on the left compared to 1 day prior. Elsewhere lungs are clear. Heart size and pulmonary vascularity are normal. No adenopathy. No bone lesions. IMPRESSION: Left a pickle pneumothorax slightly larger compared to 1 day prior with left chest tube again noted present. No tension component. No edema or consolidation. Cardiac silhouette within normal limits. Electronically Signed   By: Lowella Grip III M.D.   On: 07/12/2019 08:51   Dg Chest Port 1 View  Result Date: 07/11/2019 CLINICAL DATA:  Chest tube placement for pneumothorax EXAM: PORTABLE CHEST 1 VIEW COMPARISON:  July 11, 2019 study obtained earlier in the day FINDINGS: A chest tube is been placed on the left laterally. There has been near complete resolution of pneumothorax on the left with only small a pickle and apicolateral component of pneumothorax present currently. No tension component. There is subcutaneous air on the left laterally. Lungs elsewhere are clear. Heart size and pulmonary vascularity are normal. No adenopathy. No bone lesions. IMPRESSION: Chest tube placed on the left with near complete resolution of pneumothorax. Small a pickle and apicolateral pneumothorax on the left remains. There is subcutaneous air on the left laterally near the chest tube. No edema or consolidation. Cardiac silhouette within normal limits. No adenopathy. Electronically Signed   By: Lowella Grip III M.D.   On: 07/11/2019 14:39        Scheduled Meds: . enoxaparin (LOVENOX) injection  40 mg Subcutaneous Q24H   Continuous Infusions: . sodium chloride 10 mL/hr at 07/11/19 2100          Aline August, MD [facility] 07/12/2019, 9:59 AM

## 2019-07-12 NOTE — Progress Notes (Signed)
NAME:  Tim Flores, MRN:  [id number], DOB:  Feb 15, 2001, LOS: 0 ADMISSION DATE:  07/11/2019, CONSULTATION DATE: 07/11/2019 REFERRING MD: ED physician, CHIEF COMPLAINT: Left primary spontaneous pneumothorax   History of present illness   18 year old male states he woke up this morning and suddenly found him to have chest pain on the left side and maybe some mild shortness of breath.  Went to the urgent care found to have moderate-sized left pneumothorax.  Brought to: ER.  There left-sided chest tube mild pigtail catheter has been placed to drainage and suction.  With this the pneumothorax is nearly resolved.  Pulmonary has been consulted by the hospitalist and the ER team to help with to proctostomy management.  His mom is at the bedside and attest that he is adopted but his biological dad is known to be tall.  Patient routinely carries 200 pounds of weight working for a [employer].  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  Denies any Valsalva maneuver.  He does admit to smoking tobacco and marijuana occasionally.  He is at moderate risk for COVID-19 given the fact he is a teenager working around.  Results are pending but he denies any Covid symptoms  Past Medical History   Past Medical History:  Diagnosis Date  . Bipolar 2 disorder ([place])   History of smoking No past surgical history No allergies   Significant Hospital Events   10/24-chest tube placed for pneumothorax  Consults:  Pulmonary consult 07/11/2019  Procedures:  Chest tube placement for pneumothorax 07/11/2019  Significant Diagnostic Tests:  Chest x-ray post chest tube placement reveals almost complete resolution of pneumothorax  Micro Data:  Plan  Antimicrobials:  None  Interim history/subjective:  No significant pain or discomfort Breathing feels a lot better  Objective   Blood pressure  105/64, pulse 61, temperature 98.2 F (36.8 C), temperature source Oral, resp. rate 18, height 6\' 2"  (1.88 m), weight 68 kg, SpO2 100 %.        Intake/Output Summary (Last 24 hours) at 07/12/2019 0755 Last data filed at 07/11/2019 2100 Gross per 24 hour  Intake 3.83 ml  Output -  Net 3.83 ml   Filed Weights   07/11/19 2046  Weight: 68 kg    Examination: General: Young gentleman does not appear to be in distress HENT: Moist oral mucosa Lungs: Clear breath sounds bilaterally Cardiovascular: S1-S2 appreciated Abdomen: Soft, bowel sounds appreciated Extremities: No clubbing, no edema Neuro: Alert and oriented x3 GU:  Resolved Hospital Problem list     Assessment & Plan:  Left-sided spontaneous pneumothorax -Near resolution of pneumothorax post chest tube placement -Does not have underlying lung disease known to him -No significant family history -No history of illicit drug use -Occasional smoker  As patient is feeling more comfortable with no airleak Will take chest tube off suction Follow chest x-rays    Best practice:  Diet: Regular Pain/Anxiety/Delirium protocol (if indicated): As needed morphine DVT prophylaxis: Lovenox Mobility: Bedrest Code Status: Full code Disposition:   Labs   CBC: Recent Labs  Lab 07/11/19 1106 07/11/19 2025 07/12/19 0502  WBC 10.0 11.7* 8.7  HGB 15.6 16.2 15.6  HCT 45.7 47.0 44.4  MCV 88.9 87.2 87.7  PLT 172 205 867    Basic Metabolic Panel: Recent Labs  Lab 07/11/19 1106 07/11/19 2025 07/12/19 0502  NA 137  --  140  K 4.1  --  3.9  CL 107  --  107  CO2 22  --  23  GLUCOSE 99  --  87  BUN 11  --  12  CREATININE 0.90 1.03 0.86  CALCIUM 9.2  --  9.5    Past Medical History  He,  has a past medical history of Bipolar 2 disorder (HCC).   Surgical History   History reviewed. No pertinent surgical history.   Social History   reports that he has been smoking. He has never used smokeless tobacco. He reports  previous alcohol use. He reports current drug use. Drug: Marijuana.   Family History   His family history is not on file.   Allergies No Known Allergies   Tin Engram, PCCM

## 2019-07-12 NOTE — Plan of Care (Signed)

## 2019-07-13 ENCOUNTER — Inpatient Hospital Stay (HOSPITAL_COMMUNITY): Payer: 59

## 2019-07-13 DIAGNOSIS — J9311 Primary spontaneous pneumothorax: Secondary | ICD-10-CM | POA: Diagnosis not present

## 2019-07-13 MED ORDER — TRAMADOL HCL 50 MG PO TABS
50.0000 mg | ORAL_TABLET | Freq: Four times a day (QID) | ORAL | Status: DC | PRN
  Administered 2019-07-13 – 2019-07-16 (×5): 50 mg via ORAL
  Filled 2019-07-13 (×5): qty 1

## 2019-07-13 MED ORDER — POLYETHYLENE GLYCOL 3350 17 G PO PACK
17.0000 g | PACK | Freq: Every day | ORAL | Status: DC | PRN
  Administered 2019-07-15: 17 g via ORAL
  Filled 2019-07-13: qty 1

## 2019-07-13 MED ORDER — SENNOSIDES-DOCUSATE SODIUM 8.6-50 MG PO TABS
1.0000 | ORAL_TABLET | Freq: Two times a day (BID) | ORAL | Status: DC
  Administered 2019-07-13 – 2019-07-16 (×7): 1 via ORAL
  Filled 2019-07-13 (×8): qty 1

## 2019-07-13 MED ORDER — MORPHINE SULFATE (PF) 2 MG/ML IV SOLN: 1.0000 mg | INTRAVENOUS | Status: DC | PRN

## 2019-07-13 NOTE — Progress Notes (Signed)
NAME:  Javanni Maring, MRN:  [id number], DOB:  2001-04-29, LOS: 1 ADMISSION DATE:  07/11/2019, CONSULTATION DATE: 07/11/2019 REFERRING MD: ED physician, CHIEF COMPLAINT: Left primary spontaneous pneumothorax  Brief History   18 year old male with hx of Bipolar and tobacco abuse presenting with acute onset of left sided chest pain with SOB.  Found to have moderate size left pneumothorax.  Pigtail catheter placed in ER with resolution of PTX.  PCCM consulted for CT management.  Attempted to transition to waterseal 10/25 with increase in PTX requiring placement of suction.    Past Medical History  Bipolar type 2 and tobacco abuse  - adopted but biological father is tall.  Works in Biomedical scientist with routine heaving lifting   [place] Hospital Events   10/24-chest tube placed for pneumothorax  Consults:  Pulmonary consult 07/11/2019  Procedures:  Chest tube placement for pneumothorax 07/11/2019  Significant Diagnostic Tests:  10/24 CXR- mod to larg left PTX 10/24 CXR- s/p pigtail CT placement with small residual left apicolateral PTA  10/25 CXR - slightly larger left PTX, no tension component 10/26 CXR - persistent small to mod left PTX- 10% unchanged   Micro Data:  10/24 SARS CoV2 >> negative x 2  Antimicrobials:  None  Interim history/subjective:  Reports feels better than yesterday.  Slight discomfort at CT insertion site and uncomfortable pressure and "bubbly" sensation on deep breaths in left upper chest otherwise no SOB and on room air.  Objective   Blood pressure 138/76, pulse (!) 53, temperature 99 F (37.2 C), temperature source Oral, resp. rate 16, height 6\' 2"  (1.88 m), weight 68 kg, SpO2 99 %.        Intake/Output Summary (Last 24 hours) at 07/13/2019 1201 Last data filed at 07/13/2019 0854 Gross per 24 hour  Intake 240 ml  Output 0 ml  Net 240 ml   Filed Weights   07/11/19 2046  Weight: 68 kg   Examination: General:  Tall/ thin young adult male lying  in bed in NAD HEENT: MM pink/moist Neuro: A/Ox 3, MAE CV: rr, (sinus brady), no murmur PULM:  CTA, left lateral pigtail CT- no airleak to 20 cm suction GI: soft, +bs  Extremities: warm/dry, no edema  Skin: no rashes  Resolved Hospital Problem list     Assessment & Plan:  Left-sided spontaneous pneumothorax - several risk factors- tall/thin, smoker, physically demanding job/ lifting heavy things, and UDS + THC/ benzo/ opiates P:  Ongoing residual small PTX despite being on 20 cm for the last 24 hours. CT is patent.  Will increase suction to 40 cm and repeat CXR in am.  Ideally would benefit from CT chest if can go on suction.   Flush catheter q 8hr per cook catheter order set ordered Ongoing tele/ pulse-ox monitoring  PCCM will continue to monitor   Best practice:  Diet: Regular Pain/Anxiety/Delirium protocol (if indicated): As needed morphine DVT prophylaxis: Lovenox Mobility: Bedrest Code Status: Full code Disposition: tele   Labs   CBC: Recent Labs  Lab 07/11/19 1106 07/11/19 2025 07/12/19 0502  WBC 10.0 11.7* 8.7  HGB 15.6 16.2 15.6  HCT 45.7 47.0 44.4  MCV 88.9 87.2 87.7  PLT 172 205 160    Basic Metabolic Panel: Recent Labs  Lab 07/11/19 1106 07/11/19 2025 07/12/19 0502  NA 137  --  140  K 4.1  --  3.9  CL 107  --  107  CO2 22  --  23  GLUCOSE 99  --  87  BUN 11  --  12  CREATININE 0.90 1.03 0.86  CALCIUM 9.2  --  9.5      Posey Boyer, MSN, AGACNP-BC [facility] 07/13/2019, 12:01 PM

## 2019-07-13 NOTE — Progress Notes (Signed)
Patient ID: Tim Flores, male   DOB: [birth year], 18 y.o.   MRN: [id number]  PROGRESS NOTE    Tim CruelMatthew Flores  NWG:[id number] DOB: [birth year] DOA: 07/11/2019 PCP: Patient, No Pcp Per   Brief Narrative:  18-year-old male with history of bipolar disorder presented on 07/11/2019 with left-sided chest pain and palpitations.  He was found to have abnormal EKG with diffuse ST elevation along with moderate to large left-sided pneumothorax.  Chest tube was inserted.  Pulmonary was consulted.  Assessment & Plan:   Spontaneous left-sided pneumothorax -Status post left-sided chest tube placement resulting in much improvement. -Pulmonary following and managing chest tube.  Chest x-ray this morning still showing small to moderate left pneumothorax. -COVID-19 testing negative  Abnormal EKG: Resolved -Initial EKG showed question of diffuse ST elevation with concern for Covid myopericarditis -EKG on 07/12/2019 only showed sinus bradycardia.  Leukocytosis -Probably reactive.  Resolved.  Urine drug screen positive for benzodiazepines, opiates and tetrahydrocannabinol.   DVT prophylaxis: Lovenox Code Status: Full Family Communication: Spoke to patient at bedside.  No family at bedside. Disposition Plan: Home in 1 to 2 days once cleared by pulmonary  Consultants: Pulmonary  Procedures: Chest tube placement  Antimicrobials: None   Subjective: Patient seen and examined at bedside.  Complains of some soreness around chest tube site.  No worsening difficulty breathing, shortness of breath or fevers. Objective: Vitals:   07/12/19 1638 07/12/19 1923 07/13/19 0000 07/13/19 0845  BP: 136/74 (!) 143/81 138/76   Pulse: 66 66 64 (!) 53  Resp: (!) 25 17 17 16   Temp: 99.1 F (37.3 C) 99.2 F (37.3 C) 99 F (37.2 C)   TempSrc: Oral Oral Oral   SpO2: 98% 99% 99% 99%  Weight:      Height:        Intake/Output Summary (Last 24 hours) at 07/13/2019 0919 Last data filed at 07/13/2019 0854 Gross per  24 hour  Intake 379.95 ml  Output 0 ml  Net 379.95 ml   Filed Weights   07/11/19 2046  Weight: 68 kg    Examination:  General exam: No acute distress. Respiratory system: Bilateral decreased breath sounds at bases with left-sided chest tube present.  No wheezing Cardiovascular system: Rate controlled, S1-S2 heard Gastrointestinal system: Abdomen is nondistended, soft and nontender. Normal bowel sounds heard. Extremities: No cyanosis, edema    Data Reviewed: I have personally reviewed following labs and imaging studies  CBC: Recent Labs  Lab 07/11/19 1106 07/11/19 2025 07/12/19 0502  WBC 10.0 11.7* 8.7  HGB 15.6 16.2 15.6  HCT 45.7 47.0 44.4  MCV 88.9 87.2 87.7  PLT 172 205 186   Basic Metabolic Panel: Recent Labs  Lab 07/11/19 1106 07/11/19 2025 07/12/19 0502  NA 137  --  140  K 4.1  --  3.9  CL 107  --  107  CO2 22  --  23  GLUCOSE 99  --  87  BUN 11  --  12  CREATININE 0.90 1.03 0.86  CALCIUM 9.2  --  9.5   GFR: Estimated Creatinine Clearance: 132.9 mL/min (by C-G formula based on SCr of 0.86 mg/dL). Liver Function Tests: Recent Labs  Lab 07/12/19 0502  AST 13*  ALT 13  ALKPHOS 55  BILITOT 2.8*  PROT 6.3*  ALBUMIN 4.0   No results for input(s): LIPASE, AMYLASE in the last 168 hours. No results for input(s): AMMONIA in the last 168 hours. Coagulation Profile: No results for input(s): INR, PROTIME in the last 168 hours.  Cardiac Enzymes: No results for input(s): CKTOTAL, CKMB, CKMBINDEX, TROPONINI in the last 168 hours. BNP (last 3 results) No results for input(s): PROBNP in the last 8760 hours. HbA1C: No results for input(s): HGBA1C in the last 72 hours. CBG: No results for input(s): GLUCAP in the last 168 hours. Lipid Profile: No results for input(s): CHOL, HDL, LDLCALC, TRIG, CHOLHDL, LDLDIRECT in the last 72 hours. Thyroid Function Tests: No results for input(s): TSH, T4TOTAL, FREET4, T3FREE, THYROIDAB in the last 72 hours. Anemia Panel:  No results for input(s): VITAMINB12, FOLATE, FERRITIN, TIBC, IRON, RETICCTPCT in the last 72 hours. Sepsis Labs: No results for input(s): PROCALCITON, LATICACIDVEN in the last 168 hours.  Recent Results (from the past 240 hour(s))  SARS CORONAVIRUS 2 (TAT 6-24 HRS) Nasopharyngeal Nasopharyngeal Swab     Status: None   Collection Time: 07/11/19  1:01 PM   Specimen: Nasopharyngeal Swab  Result Value Ref Range Status   SARS Coronavirus 2 NEGATIVE NEGATIVE Final    Comment: (NOTE) SARS-CoV-2 target nucleic acids are NOT DETECTED. The SARS-CoV-2 RNA is generally detectable in upper and lower respiratory specimens during the acute phase of infection. Negative results do not preclude SARS-CoV-2 infection, do not rule out co-infections with other pathogens, and should not be used as the sole basis for treatment or other patient management decisions. Negative results must be combined with clinical observations, patient history, and epidemiological information. The expected result is Negative. Fact Sheet for Patients: HairSlick.no Fact Sheet for Healthcare Providers: quierodirigir.com This test is not yet approved or cleared by the [place] FDA and  has been authorized for detection and/or diagnosis of SARS-CoV-2 by FDA under an Emergency Use Authorization (EUA). This EUA will remain  in effect (meaning this test can be used) for the duration of the COVID-19 declaration under Section 56 4(b)(1) of the Act, 21 U.S.C. section 360bbb-3(b)(1), unless the authorization is terminated or revoked sooner. Performed at [facility], [address], [place], [place] [address]   SARS CORONAVIRUS 2 (TAT 6-24 HRS) Nasopharyngeal Nasopharyngeal Swab     Status: None   Collection Time: 07/11/19 11:51 PM   Specimen: Nasopharyngeal Swab  Result Value Ref Range Status   SARS Coronavirus 2 NEGATIVE NEGATIVE Final    Comment: (NOTE)  SARS-CoV-2 target nucleic acids are NOT DETECTED. The SARS-CoV-2 RNA is generally detectable in upper and lower respiratory specimens during the acute phase of infection. Negative results do not preclude SARS-CoV-2 infection, do not rule out co-infections with other pathogens, and should not be used as the sole basis for treatment or other patient management decisions. Negative results must be combined with clinical observations, patient history, and epidemiological information. The expected result is Negative. Fact Sheet for Patients: HairSlick.no Fact Sheet for Healthcare Providers: quierodirigir.com This test is not yet approved or cleared by the [place] FDA and  has been authorized for detection and/or diagnosis of SARS-CoV-2 by FDA under an Emergency Use Authorization (EUA). This EUA will remain  in effect (meaning this test can be used) for the duration of the COVID-19 declaration under Section 56 4(b)(1) of the Act, 21 U.S.C. section 360bbb-3(b)(1), unless the authorization is terminated or revoked sooner. Performed at [facility], [address], [place], [place] [address]          Radiology Studies: Dg Chest 2 View  Result Date: 07/11/2019 CLINICAL DATA:  Left-sided chest pain and shortness of breath. EXAM: CHEST - 2 VIEW COMPARISON:  None. FINDINGS: The heart size  and mediastinal contours are within normal limits. Normal pulmonary vascularity. Moderate to large left pneumothorax. No significant mediastinal shift. No consolidation or pleural effusion. No acute osseous abnormality. IMPRESSION: 1. Moderate to large left pneumothorax. Critical Value/emergent results were called by telephone at the time of interpretation on 07/11/2019 at 12:49 pm to providerANKIT NANAVATI , who verbally acknowledged these results. Electronically Signed   By: Titus Dubin M.D.   On: 07/11/2019 12:51   Dg Chest Port 1 View   Result Date: 07/13/2019 CLINICAL DATA:  18 year old male with history of pneumothorax. Chest tube. EXAM: PORTABLE CHEST 1 VIEW COMPARISON:  Chest x-ray 07/12/2019. FINDINGS: Previously noted left-sided chest tube remains in stable position with tip in the lateral aspect of the left hemithorax. Small to moderate left pneumothorax occupying approximately 10% of the volume of the left hemithorax is similar to the prior study. No consolidative airspace disease. No pleural effusions. No suspicious appearing pulmonary nodules or masses are noted. No evidence of pulmonary edema. Heart size is normal. Upper mediastinal contours are within normal limits. IMPRESSION: 1. Persistent small to moderate left pneumothorax occupying 10% of the volume of the left hemithorax with unchanged position of left-sided chest tube, as above. Electronically Signed   By: Vinnie Langton M.D.   On: 07/13/2019 08:09   Dg Chest Port 1 View  Result Date: 07/12/2019 CLINICAL DATA:  Left-sided pneumothorax EXAM: PORTABLE CHEST 1 VIEW COMPARISON:  July 11, 2019 FINDINGS: Chest tube remains on the left laterally. Left apical pneumothorax is slightly larger without tension component. There is less soft tissue air laterally on the left compared to 1 day prior. Elsewhere lungs are clear. Heart size and pulmonary vascularity are normal. No adenopathy. No bone lesions. IMPRESSION: Left a pickle pneumothorax slightly larger compared to 1 day prior with left chest tube again noted present. No tension component. No edema or consolidation. Cardiac silhouette within normal limits. Electronically Signed   By: Lowella Grip III M.D.   On: 07/12/2019 08:51   Dg Chest Port 1 View  Result Date: 07/11/2019 CLINICAL DATA:  Chest tube placement for pneumothorax EXAM: PORTABLE CHEST 1 VIEW COMPARISON:  July 11, 2019 study obtained earlier in the day FINDINGS: A chest tube is been placed on the left laterally. There has been near complete resolution  of pneumothorax on the left with only small a pickle and apicolateral component of pneumothorax present currently. No tension component. There is subcutaneous air on the left laterally. Lungs elsewhere are clear. Heart size and pulmonary vascularity are normal. No adenopathy. No bone lesions. IMPRESSION: Chest tube placed on the left with near complete resolution of pneumothorax. Small a pickle and apicolateral pneumothorax on the left remains. There is subcutaneous air on the left laterally near the chest tube. No edema or consolidation. Cardiac silhouette within normal limits. No adenopathy. Electronically Signed   By: Lowella Grip III M.D.   On: 07/11/2019 14:39        Scheduled Meds: . enoxaparin (LOVENOX) injection  40 mg Subcutaneous Q24H   Continuous Infusions: . sodium chloride Stopped (07/12/19 1141)          Aline August, MD [facility] 07/13/2019, 9:19 AM

## 2019-07-13 NOTE — Plan of Care (Signed)

## 2019-07-14 ENCOUNTER — Inpatient Hospital Stay (HOSPITAL_COMMUNITY): Payer: 59

## 2019-07-14 DIAGNOSIS — J9311 Primary spontaneous pneumothorax: Secondary | ICD-10-CM | POA: Diagnosis not present

## 2019-07-14 NOTE — Plan of Care (Signed)
?  Problem: Education: ?Goal: Knowledge of General Education information will improve ?Description: Including pain rating scale, medication(s)/side effects and non-pharmacologic comfort measures ?Outcome: Progressing ?  ?Problem: Clinical Measurements: ?Goal: Ability to maintain clinical measurements within normal limits will improve ?Outcome: Progressing ?Goal: Will remain free from infection ?Outcome: Progressing ?Goal: Diagnostic test results will improve ?Outcome: Progressing ?Goal: Respiratory complications will improve ?Outcome: Progressing ?Goal: Cardiovascular complication will be avoided ?Outcome: Progressing ?  ?Problem: Pain Managment: ?Goal: General experience of comfort will improve ?Outcome: Progressing ?  ?Problem: Safety: ?Goal: Ability to remain free from injury will improve ?Outcome: Progressing ?  ?Problem: Skin Integrity: ?Goal: Risk for impaired skin integrity will decrease ?Outcome: Progressing ?  ?

## 2019-07-14 NOTE — Progress Notes (Signed)
Patient ID: Tim Flores, male   DOB: [birth year], 18 y.o.   MRN: [id number]  PROGRESS NOTE    Tim Flores  [id number] DOB: [birth year] DOA: 07/11/2019 PCP: Patient, No Pcp Per   Brief Narrative:  18 year old male with history of bipolar disorder presented on 07/11/2019 with left-sided chest pain and palpitations.  He was found to have abnormal EKG with diffuse ST elevation along with moderate to large left-sided pneumothorax.  Chest tube was inserted.  Pulmonary was consulted.  Assessment & Plan:   Spontaneous left-sided pneumothorax -Status post left-sided chest tube placement resulting in much improvement. -Pulmonary following and managing chest tube.  Chest x-ray today showed slight decrease in left apical pneumothorax. -COVID-19 testing negative  Abnormal EKG: Resolved -Initial EKG showed question of diffuse ST elevation with concern for Covid myopericarditis -EKG on 07/12/2019 only showed sinus bradycardia.  Leukocytosis -Probably reactive.  Resolved.  Urine drug screen positive for benzodiazepines, opiates and tetrahydrocannabinol.   DVT prophylaxis: Lovenox Code Status: Full Family Communication: Spoke to patient at bedside.  No family at bedside. Disposition Plan: Home once chest tube comes out and cleared by pulmonary  Consultants: Pulmonary  Procedures: Chest tube placement  Antimicrobials: None   Subjective: Patient seen and examined at bedside.  Denies any worsening shortness of breath, cough or overnight fevers  objective: Vitals:   07/13/19 1618 07/13/19 1924 07/13/19 2313 07/14/19 0346  BP: 134/67 (!) 130/58 (!) 103/51 (!) 110/54  Pulse:      Resp:  20    Temp: 98.4 F (36.9 C) 98.2 F (36.8 C) 98.9 F (37.2 C) 98.8 F (37.1 C)  TempSrc: Oral Oral Oral Oral  SpO2:      Weight:      Height:        Intake/Output Summary (Last 24 hours) at 07/14/2019 0731 Last data filed at 07/13/2019 1523 Gross per 24 hour  Intake 240 ml  Output 30 ml   Net 210 ml   Filed Weights   07/11/19 2046  Weight: 68 kg    Examination:  General exam: No distress Respiratory system: Bilateral decreased breath sounds at bases.  Still has left-sided chest tube  cardiovascular system: S1-S2 heard, bradycardic  gastrointestinal system: Abdomen is nondistended, soft and nontender. Normal bowel sounds heard. Extremities: No cyanosis, edema    Data Reviewed: I have personally reviewed following labs and imaging studies  CBC: Recent Labs  Lab 07/11/19 1106 07/11/19 2025 07/12/19 0502  WBC 10.0 11.7* 8.7  HGB 15.6 16.2 15.6  HCT 45.7 47.0 44.4  MCV 88.9 87.2 87.7  PLT 172 205 186   Basic Metabolic Panel: Recent Labs  Lab 07/11/19 1106 07/11/19 2025 07/12/19 0502  NA 137  --  140  K 4.1  --  3.9  CL 107  --  107  CO2 22  --  23  GLUCOSE 99  --  87  BUN 11  --  12  CREATININE 0.90 1.03 0.86  CALCIUM 9.2  --  9.5   GFR: Estimated Creatinine Clearance: 132.9 mL/min (by C-G formula based on SCr of 0.86 mg/dL). Liver Function Tests: Recent Labs  Lab 07/12/19 0502  AST 13*  ALT 13  ALKPHOS 55  BILITOT 2.8*  PROT 6.3*  ALBUMIN 4.0   No results for input(s): LIPASE, AMYLASE in the last 168 hours. No results for input(s): AMMONIA in the last 168 hours. Coagulation Profile: No results for input(s): INR, PROTIME in the last 168 hours. Cardiac Enzymes: No results for input(s): CKTOTAL,  CKMB, CKMBINDEX, TROPONINI in the last 168 hours. BNP (last 3 results) No results for input(s): PROBNP in the last 8760 hours. HbA1C: No results for input(s): HGBA1C in the last 72 hours. CBG: No results for input(s): GLUCAP in the last 168 hours. Lipid Profile: No results for input(s): CHOL, HDL, LDLCALC, TRIG, CHOLHDL, LDLDIRECT in the last 72 hours. Thyroid Function Tests: No results for input(s): TSH, T4TOTAL, FREET4, T3FREE, THYROIDAB in the last 72 hours. Anemia Panel: No results for input(s): VITAMINB12, FOLATE, FERRITIN, TIBC, IRON,  RETICCTPCT in the last 72 hours. Sepsis Labs: No results for input(s): PROCALCITON, LATICACIDVEN in the last 168 hours.  Recent Results (from the past 240 hour(s))  SARS CORONAVIRUS 2 (TAT 6-24 HRS) Nasopharyngeal Nasopharyngeal Swab     Status: None   Collection Time: 07/11/19  1:01 PM   Specimen: Nasopharyngeal Swab  Result Value Ref Range Status   SARS Coronavirus 2 NEGATIVE NEGATIVE Final    Comment: (NOTE) SARS-CoV-2 target nucleic acids are NOT DETECTED. The SARS-CoV-2 RNA is generally detectable in upper and lower respiratory specimens during the acute phase of infection. Negative results do not preclude SARS-CoV-2 infection, do not rule out co-infections with other pathogens, and should not be used as the sole basis for treatment or other patient management decisions. Negative results must be combined with clinical observations, patient history, and epidemiological information. The expected result is Negative. Fact Sheet for Patients: SugarRoll.be Fact Sheet for Healthcare Providers: https://www.woods-mathews.com/ This test is not yet approved or cleared by the [place] FDA and  has been authorized for detection and/or diagnosis of SARS-CoV-2 by FDA under an Emergency Use Authorization (EUA). This EUA will remain  in effect (meaning this test can be used) for the duration of the COVID-19 declaration under Section 56 4(b)(1) of the Act, 21 U.S.C. section 360bbb-3(b)(1), unless the authorization is terminated or revoked sooner. Performed at [facility], [place] [address] [place], [place] [address]   SARS CORONAVIRUS 2 (TAT 6-24 HRS) Nasopharyngeal Nasopharyngeal Swab     Status: None   Collection Time: 07/11/19 11:51 PM   Specimen: Nasopharyngeal Swab  Result Value Ref Range Status   SARS Coronavirus 2 NEGATIVE NEGATIVE Final    Comment: (NOTE) SARS-CoV-2 target nucleic acids are NOT DETECTED. The SARS-CoV-2 RNA is  generally detectable in upper and lower respiratory specimens during the acute phase of infection. Negative results do not preclude SARS-CoV-2 infection, do not rule out co-infections with other pathogens, and should not be used as the sole basis for treatment or other patient management decisions. Negative results must be combined with clinical observations, patient history, and epidemiological information. The expected result is Negative. Fact Sheet for Patients: SugarRoll.be Fact Sheet for Healthcare Providers: https://www.woods-mathews.com/ This test is not yet approved or cleared by the [place] FDA and  has been authorized for detection and/or diagnosis of SARS-CoV-2 by FDA under an Emergency Use Authorization (EUA). This EUA will remain  in effect (meaning this test can be used) for the duration of the COVID-19 declaration under Section 56 4(b)(1) of the Act, 21 U.S.C. section 360bbb-3(b)(1), unless the authorization is terminated or revoked sooner. Performed at [facility], [place] [address], [place], [place] [address]          Radiology Studies: Dg Chest [place] 1 View  Result Date: 07/13/2019 CLINICAL DATA:  18 year old male with history of pneumothorax. Chest tube. EXAM: PORTABLE CHEST 1 VIEW COMPARISON:  Chest x-ray 07/12/2019. FINDINGS: Previously noted left-sided chest tube remains  in stable position with tip in the lateral aspect of the left hemithorax. Small to moderate left pneumothorax occupying approximately 10% of the volume of the left hemithorax is similar to the prior study. No consolidative airspace disease. No pleural effusions. No suspicious appearing pulmonary nodules or masses are noted. No evidence of pulmonary edema. Heart size is normal. Upper mediastinal contours are within normal limits. IMPRESSION: 1. Persistent small to moderate left pneumothorax occupying 10% of the volume of the left hemithorax with  unchanged position of left-sided chest tube, as above. Electronically Signed   By: Trudie Reed M.D.   On: 07/13/2019 08:09   Dg Chest Port 1 View  Result Date: 07/12/2019 CLINICAL DATA:  Left-sided pneumothorax EXAM: PORTABLE CHEST 1 VIEW COMPARISON:  July 11, 2019 FINDINGS: Chest tube remains on the left laterally. Left apical pneumothorax is slightly larger without tension component. There is less soft tissue air laterally on the left compared to 1 day prior. Elsewhere lungs are clear. Heart size and pulmonary vascularity are normal. No adenopathy. No bone lesions. IMPRESSION: Left a pickle pneumothorax slightly larger compared to 1 day prior with left chest tube again noted present. No tension component. No edema or consolidation. Cardiac silhouette within normal limits. Electronically Signed   By: Bretta Bang III M.D.   On: 07/12/2019 08:51        Scheduled Meds: . enoxaparin (LOVENOX) injection  40 mg Subcutaneous Q24H  . senna-docusate  1 tablet Oral BID   Continuous Infusions: . sodium chloride Stopped (07/12/19 1141)          Glade Lloyd, MD [facility] 07/14/2019, 7:31 AM

## 2019-07-14 NOTE — Progress Notes (Signed)
NAME:  Tim Flores, MRN:  [id number], DOB:  [birth year], LOS: 2 ADMISSION DATE:  07/11/2019, CONSULTATION DATE: 07/11/2019 REFERRING MD: ED physician, CHIEF COMPLAINT: Left primary spontaneous pneumothorax  Brief History   18 year old male with hx of Bipolar and tobacco abuse presenting with acute onset of left sided chest pain with SOB.  Found to have moderate size left pneumothorax.  Pigtail catheter placed in ER with resolution of PTX.  PCCM consulted for CT management.  Attempted to transition to waterseal 10/25 with increase in PTX requiring placement of suction.    Past Medical History  Bipolar type 2 and tobacco abuse  - adopted but biological father is tall.  Works in Biomedical scientist with routine heaving lifting   [facility]   10/24-chest tube placed for pneumothorax  Consults:  Pulmonary consult 07/11/2019  Procedures:  Chest tube placement for pneumothorax 07/11/2019  Significant Diagnostic Tests:  10/24 CXR- mod to larg left PTX 10/24 CXR- s/p pigtail CT placement with small residual left apicolateral PTA  10/25 CXR - slightly larger left PTX, no tension component 10/26 CXR - persistent small to mod left PTX- 10% unchanged   Micro Data:  10/24 SARS CoV2 >> negative x 2  Antimicrobials:  None  Interim history/subjective:  Pain and SOB improved today. PTX smaller on CXR, but still persists as about a 10% PTX in the L apex.   Objective   Blood pressure (!) 110/54, pulse (!) 53, temperature 98.8 F (37.1 C), temperature source Oral, resp. rate 20, height 6\' 2"  (1.88 m), weight 68 kg, SpO2 99 %.        Intake/Output Summary (Last 24 hours) at 07/14/2019 0829 Last data filed at 07/13/2019 1523 Gross per 24 hour  Intake 240 ml  Output 30 ml  Net 210 ml   Filed Weights   07/11/19 2046  Weight: 68 kg   Examination: General:  Tall/ thin young adult male lying in bed in NAD HEENT: [place]/AT, PERRL, no JVD Neuro: Alert, oriented, non-focal CV: RRR,  no MRG PULM:  CTA, left lateral pigtail CT. Dressing CDI.  no airleak to 40 cm suction GI: soft, non-tender, non-distended Extremities: No acute deformity or ROM limitation Skin: Grossly intact.   Resolved Hospital Problem list     Assessment & Plan:  Left-sided spontaneous pneumothorax - several risk factors- tall/thin, smoker, physically demanding job/ lifting heavy things, and UDS + THC/ benzo/ opiates  10/27- symptoms imaging improved today on 40cm H2O suction.   P:  Ongoing residual small PTX despite being on 40 cm for the last 24 hours. Seeing as he is improved somewhat today, would hold off on CT. If PTX now radiographically resolved tomorrow would favor CT scan. Flush catheter per guidelines.  Ongoing tele/ pulse-ox monitoring  PCCM will follow.   Best practice:  Diet: Regular Pain/Anxiety/Delirium protocol (if indicated): As needed morphine DVT prophylaxis: Lovenox Mobility: Bedrest Code Status: Full code Disposition: tele   Labs   CBC: Recent Labs  Lab 07/11/19 1106 07/11/19 2025 07/12/19 0502  WBC 10.0 11.7* 8.7  HGB 15.6 16.2 15.6  HCT 45.7 47.0 44.4  MCV 88.9 87.2 87.7  PLT 172 205 235    Basic Metabolic Panel: Recent Labs  Lab 07/11/19 1106 07/11/19 2025 07/12/19 0502  NA 137  --  140  K 4.1  --  3.9  CL 107  --  107  CO2 22  --  23  GLUCOSE 99  --  87  BUN 11  --  12  CREATININE 0.90 1.03 0.86  CALCIUM 9.2  --  9.5     Joneen Roach, AGACNP-BC [facility] Pulmonary/Critical Care Pager [phone] or [phone]  07/14/2019 8:34 AM

## 2019-07-15 ENCOUNTER — Inpatient Hospital Stay (HOSPITAL_COMMUNITY): Payer: 59

## 2019-07-15 DIAGNOSIS — J9311 Primary spontaneous pneumothorax: Secondary | ICD-10-CM | POA: Diagnosis not present

## 2019-07-15 LAB — GLUCOSE, CAPILLARY: Glucose-Capillary: 95 mg/dL (ref 70–99)

## 2019-07-15 NOTE — Progress Notes (Signed)
Patient ID: Tim Flores, male   DOB: Jan 24, 2001, 18 y.o.   MRN: [id number]  PROGRESS NOTE    Tim Flores  OVF:[id number] DOB: [birth year] DOA: 07/11/2019 PCP: Patient, No Pcp Per   Brief Narrative:  18 year old male with history of bipolar disorder presented on 07/11/2019 with left-sided chest pain and palpitations.  He was found to have abnormal EKG with diffuse ST elevation along with moderate to large left-sided pneumothorax.  Chest tube was inserted.  Pulmonary was consulted.  Assessment & Plan: Spontaneous left-sided pneumothorax Improving Status post left-sided chest tube placement Repeat chest x-ray today showed essential resolution of left-sided pneumothorax with potential tiny amount of air remaining over the left apex and adjacent to the left heart border PCCM on board, appreciate recs   Polysubstance abuse/tobacco abuse Urine drug screen positive for benzodiazepines, opiates and tetrahydrocannabinol Patient advised to quit   DVT prophylaxis: Lovenox Code Status: Full Family Communication: None at bedside Disposition Plan: Home once cleared by PCCM and chest tube removed  Consultants: Pulmonary  Procedures: Chest tube placement  Antimicrobials: None   Subjective: Patient seen and examined at bedside, denies any new complaints, still reporting chest pain around tube insertion when coughing, denies any worsening shortness of breath, fever/chills, abdominal pain, nausea/vomiting.   objective: Vitals:   07/14/19 1940 07/14/19 2314 07/15/19 0319 07/15/19 0727  BP: (!) 120/56 (!) 120/53 (!) 114/57 112/73  Pulse:    (!) 57  Resp:    18  Temp: 99.2 F (37.3 C) 97.8 F (36.6 C) 97.7 F (36.5 C) 98 F (36.7 C)  TempSrc: Oral Oral Oral Oral  SpO2:    96%  Weight:      Height:        Intake/Output Summary (Last 24 hours) at 07/15/2019 1545 Last data filed at 07/15/2019 0900 Gross per 24 hour  Intake 240 ml  Output -  Net 240 ml   Filed Weights   07/11/19  2046  Weight: 68 kg    Examination:  General: NAD   Cardiovascular: S1, S2 present  Respiratory:  Decreased breath sounds on the left  Abdomen: Soft, nontender, nondistended, bowel sounds present  Musculoskeletal: No bilateral pedal edema noted  Skin: Normal  Psychiatry: Normal mood    Data Reviewed: I have personally reviewed following labs and imaging studies  CBC: Recent Labs  Lab 07/11/19 1106 07/11/19 2025 07/12/19 0502  WBC 10.0 11.7* 8.7  HGB 15.6 16.2 15.6  HCT 45.7 47.0 44.4  MCV 88.9 87.2 87.7  PLT 172 205 186   Basic Metabolic Panel: Recent Labs  Lab 07/11/19 1106 07/11/19 2025 07/12/19 0502  NA 137  --  140  K 4.1  --  3.9  CL 107  --  107  CO2 22  --  23  GLUCOSE 99  --  87  BUN 11  --  12  CREATININE 0.90 1.03 0.86  CALCIUM 9.2  --  9.5   GFR: Estimated Creatinine Clearance: 132.9 mL/min (by C-G formula based on SCr of 0.86 mg/dL). Liver Function Tests: Recent Labs  Lab 07/12/19 0502  AST 13*  ALT 13  ALKPHOS 55  BILITOT 2.8*  PROT 6.3*  ALBUMIN 4.0   No results for input(s): LIPASE, AMYLASE in the last 168 hours. No results for input(s): AMMONIA in the last 168 hours. Coagulation Profile: No results for input(s): INR, PROTIME in the last 168 hours. Cardiac Enzymes: No results for input(s): CKTOTAL, CKMB, CKMBINDEX, TROPONINI in the last 168 hours. BNP (last 3  results) No results for input(s): PROBNP in the last 8760 hours. HbA1C: No results for input(s): HGBA1C in the last 72 hours. CBG: Recent Labs  Lab 07/15/19 0726  GLUCAP 95   Lipid Profile: No results for input(s): CHOL, HDL, LDLCALC, TRIG, CHOLHDL, LDLDIRECT in the last 72 hours. Thyroid Function Tests: No results for input(s): TSH, T4TOTAL, FREET4, T3FREE, THYROIDAB in the last 72 hours. Anemia Panel: No results for input(s): VITAMINB12, FOLATE, FERRITIN, TIBC, IRON, RETICCTPCT in the last 72 hours. Sepsis Labs: No results for input(s): PROCALCITON,  LATICACIDVEN in the last 168 hours.  Recent Results (from the past 240 hour(s))  SARS CORONAVIRUS 2 (TAT 6-24 HRS) Nasopharyngeal Nasopharyngeal Swab     Status: None   Collection Time: 07/11/19  1:01 PM   Specimen: Nasopharyngeal Swab  Result Value Ref Range Status   SARS Coronavirus 2 NEGATIVE NEGATIVE Final    Comment: (NOTE) SARS-CoV-2 target nucleic acids are NOT DETECTED. The SARS-CoV-2 RNA is generally detectable in upper and lower respiratory specimens during the acute phase of infection. Negative results do not preclude SARS-CoV-2 infection, do not rule out co-infections with other pathogens, and should not be used as the sole basis for treatment or other patient management decisions. Negative results must be combined with clinical observations, patient history, and epidemiological information. The expected result is Negative. Fact Sheet for Patients: SugarRoll.be Fact Sheet for Healthcare Providers: https://www.woods-mathews.com/ This test is not yet approved or cleared by the [place] FDA and  has been authorized for detection and/or diagnosis of SARS-CoV-2 by FDA under an Emergency Use Authorization (EUA). This EUA will remain  in effect (meaning this test can be used) for the duration of the COVID-19 declaration under Section 56 4(b)(1) of the Act, 21 U.S.C. section 360bbb-3(b)(1), unless the authorization is terminated or revoked sooner. Performed at [facility], [place] [address], [place], [place] [address]   SARS CORONAVIRUS 2 (TAT 6-24 HRS) Nasopharyngeal Nasopharyngeal Swab     Status: None   Collection Time: 07/11/19 11:51 PM   Specimen: Nasopharyngeal Swab  Result Value Ref Range Status   SARS Coronavirus 2 NEGATIVE NEGATIVE Final    Comment: (NOTE) SARS-CoV-2 target nucleic acids are NOT DETECTED. The SARS-CoV-2 RNA is generally detectable in upper and lower respiratory specimens during the acute phase of  infection. Negative results do not preclude SARS-CoV-2 infection, do not rule out co-infections with other pathogens, and should not be used as the sole basis for treatment or other patient management decisions. Negative results must be combined with clinical observations, patient history, and epidemiological information. The expected result is Negative. Fact Sheet for Patients: SugarRoll.be Fact Sheet for Healthcare Providers: https://www.woods-mathews.com/ This test is not yet approved or cleared by the [place] FDA and  has been authorized for detection and/or diagnosis of SARS-CoV-2 by FDA under an Emergency Use Authorization (EUA). This EUA will remain  in effect (meaning this test can be used) for the duration of the COVID-19 declaration under Section 56 4(b)(1) of the Act, 21 U.S.C. section 360bbb-3(b)(1), unless the authorization is terminated or revoked sooner. Performed at [facility], [place] [address] [place], [place] [address]          Radiology Studies: Dg Chest Port 1 View  Result Date: 07/15/2019 CLINICAL DATA:  Left chest tube placement for pneumothorax EXAM: PORTABLE CHEST 1 VIEW COMPARISON:  07/14/2019 FINDINGS: The heart size and mediastinal contours are within normal limits. Left lateral pigtail chest tube remains in place. Essential resolution  of left-sided pneumothorax with a potential tiny amount of air remaining over the left apex and adjacent to the left heart border. The visualized skeletal structures are unremarkable. IMPRESSION: Essential resolution of left-sided pneumothorax with potential tiny amount of air remaining over the left apex and adjacent to the left heart border. Electronically Signed   By: Irish LackGlenn  Yamagata M.D.   On: 07/15/2019 08:35   Dg Chest Port 1 View  Result Date: 07/14/2019 CLINICAL DATA:  Pneumothorax and chest tube in place. EXAM: PORTABLE CHEST 1 VIEW COMPARISON:  07/13/2019  FINDINGS: Pigtail chest tube is again noted along the lateral aspect of the left chest. There continues to be a small left apical pneumothorax that has slightly decreased in size. Lungs remain clear. Trachea is midline. Heart and mediastinum are within normal limits. Again noted is a small amount of subcutaneous gas in the left chest. IMPRESSION: Left apical pneumothorax has slightly decreased in size. Stable position of the left chest tube. Electronically Signed   By: Richarda OverlieAdam  Henn M.D.   On: 07/14/2019 08:22        Scheduled Meds: . enoxaparin (LOVENOX) injection  40 mg Subcutaneous Q24H  . senna-docusate  1 tablet Oral BID   Continuous Infusions: . sodium chloride Stopped (07/12/19 1141)          Briant CedarNkeiruka J Diogenes Whirley, MD [facility] 07/15/2019, 3:45 PM

## 2019-07-15 NOTE — Progress Notes (Signed)
NAME:  Tim Flores, MRN:  [id number], DOB:  2000-09-29, LOS: 3 ADMISSION DATE:  07/11/2019, CONSULTATION DATE: 07/11/2019 REFERRING MD: ED physician, CHIEF COMPLAINT: Left primary spontaneous pneumothorax  Brief History   18 year old male with hx of Bipolar and tobacco abuse presenting with acute onset of left sided chest pain with SOB.  Found to have moderate size left pneumothorax.  Pigtail catheter placed in ER with resolution of PTX.  PCCM consulted for CT management.  Attempted to transition to waterseal 10/25 with increase in PTX requiring placement of suction.    Past Medical History  Bipolar type 2 and tobacco abuse  - adopted but biological father is tall.  Works in Biomedical scientist with routine heaving lifting   [facility]   10/24-chest tube placed for pneumothorax  Consults:  Pulmonary consult 07/11/2019  Procedures:  Chest tube placement for pneumothorax 07/11/2019  Significant Diagnostic Tests:  10/24 CXR- mod to larg left PTX 10/24 CXR- s/p pigtail CT placement with small residual left apicolateral PTA  10/25 CXR - slightly larger left PTX, no tension component 10/26 CXR - persistent small to mod left PTX- 10% unchanged   Micro Data:  10/24 SARS CoV2 >> negative x 2  Antimicrobials:  None  Interim history/subjective:  Feeling better today. PTX all but resolved on CXR.   Objective   Blood pressure 112/73, pulse (!) 57, temperature 98 F (36.7 C), temperature source Oral, resp. rate 18, height 6\' 2"  (1.88 m), weight 68 kg, SpO2 96 %.        Intake/Output Summary (Last 24 hours) at 07-27-2019 0912 Last data filed at 07/14/2019 1301 Gross per 24 hour  Intake -  Output 0 ml  Net 0 ml   Filed Weights   07/11/19 2046  Weight: 68 kg   Examination: General:  Young adult male in NAD HEENT: [place]/At, PERRL, no JVD Neuro: Alert, oriented, non-focal.  CV: RRR, no MRG PULM:  Clear, L sided chest tube in place with CDI dressing.  GI: soft,  non-tender, non-distended Extremities: No acute deformity or ROM limitation Skin: Grossly intact.   Resolved Hospital Problem list     Assessment & Plan:  Left-sided spontaneous pneumothorax: several risk factors- tall/thin, smoker, physically demanding job/ lifting heavy things, and UDS + THC/ benzo/ opiates (of note UDS collected the day after he received morphine and versed in ED)  10/27- symptoms imaging improved today on 40cm H2O suction.  2023-07-27 - near resolution of PTX on 27-Jul-2023 AM  P:  Decease suction to 20cm H2O Repeat CXR in the afternoon to ensure no progression of PTX If remains stable tomorrow morning will progress to water seal.  Flush catheter per guidelines.  Ongoing tele/ pulse-ox monitoring  PCCM will follow.   Best practice:  Diet: Regular Pain/Anxiety/Delirium protocol (if indicated): As needed morphine DVT prophylaxis: Lovenox Mobility: Bedrest Code Status: Full code Disposition: tele   Labs   CBC: Recent Labs  Lab 07/11/19 1106 07/11/19 2025 07/12/19 0502  WBC 10.0 11.7* 8.7  HGB 15.6 16.2 15.6  HCT 45.7 47.0 44.4  MCV 88.9 87.2 87.7  PLT 172 205 025    Basic Metabolic Panel: Recent Labs  Lab 07/11/19 1106 07/11/19 2025 07/12/19 0502  NA 137  --  140  K 4.1  --  3.9  CL 107  --  107  CO2 22  --  23  GLUCOSE 99  --  87  BUN 11  --  12  CREATININE 0.90 1.03 0.86  CALCIUM 9.2  --  9.5     Joneen Roach, AGACNP-BC [facility] Pulmonary/Critical Care Pager [phone] or [phone]  07/15/2019 9:12 AM

## 2019-07-15 NOTE — Plan of Care (Signed)

## 2019-07-16 ENCOUNTER — Inpatient Hospital Stay (HOSPITAL_COMMUNITY): Payer: 59

## 2019-07-16 DIAGNOSIS — F1920 Other psychoactive substance dependence, uncomplicated: Secondary | ICD-10-CM

## 2019-07-16 DIAGNOSIS — F17200 Nicotine dependence, unspecified, uncomplicated: Secondary | ICD-10-CM

## 2019-07-16 DIAGNOSIS — F1120 Opioid dependence, uncomplicated: Secondary | ICD-10-CM | POA: Diagnosis not present

## 2019-07-16 DIAGNOSIS — J9311 Primary spontaneous pneumothorax: Secondary | ICD-10-CM | POA: Diagnosis not present

## 2019-07-16 LAB — CBC WITH DIFFERENTIAL/PLATELET
Abs Immature Granulocytes: 0.02 10*3/uL (ref 0.00–0.07)
Basophils Absolute: 0 10*3/uL (ref 0.0–0.1)
Basophils Relative: 1 %
Eosinophils Absolute: 0.2 10*3/uL (ref 0.0–0.5)
Eosinophils Relative: 3 %
HCT: 46.1 % (ref 39.0–52.0)
Hemoglobin: 16.1 g/dL (ref 13.0–17.0)
Immature Granulocytes: 0 %
Lymphocytes Relative: 26 %
Lymphs Abs: 1.9 10*3/uL (ref 0.7–4.0)
MCH: 30.3 pg (ref 26.0–34.0)
MCHC: 34.9 g/dL (ref 30.0–36.0)
MCV: 86.7 fL (ref 80.0–100.0)
Monocytes Absolute: 0.7 10*3/uL (ref 0.1–1.0)
Monocytes Relative: 9 %
Neutro Abs: 4.5 10*3/uL (ref 1.7–7.7)
Neutrophils Relative %: 61 %
Platelets: 203 10*3/uL (ref 150–400)
RBC: 5.32 MIL/uL (ref 4.22–5.81)
RDW: 11.7 % (ref 11.5–15.5)
WBC: 7.3 10*3/uL (ref 4.0–10.5)
nRBC: 0 % (ref 0.0–0.2)

## 2019-07-16 LAB — BASIC METABOLIC PANEL
Anion gap: 10 (ref 5–15)
BUN: 16 mg/dL (ref 6–20)
CO2: 24 mmol/L (ref 22–32)
Calcium: 9.8 mg/dL (ref 8.9–10.3)
Chloride: 102 mmol/L (ref 98–111)
Creatinine, Ser: 1.06 mg/dL (ref 0.61–1.24)
GFR calc Af Amer: >60 mL/min (ref >60)
GFR calc non Af Amer: >60 mL/min (ref >60)
Glucose, Bld: 90 mg/dL (ref 70–99)
Potassium: 4.3 mmol/L (ref 3.5–5.1)
Sodium: 136 mmol/L (ref 135–145)

## 2019-07-16 NOTE — Progress Notes (Signed)
PROGRESS NOTE  Tim Flores VQQ:[id number] DOB: 07/01/2001   PCP: Patient, No Pcp Per  Patient is from: Home  DOA: 07/11/2019 LOS: 4  Brief Narrative / Interim history: 18 year old male with history of bipolar disorder presented on 07/11/2019 with left-sided chest pain and palpitations.  He was found to have abnormal EKG with diffuse ST elevation along with moderate to large left-sided pneumothorax.  Chest tube was inserted.  Pulmonary was consulted.   Subjective: No major events overnight of this morning.  No complaints.  He denies chest pain, dyspnea, cough, GI or GU symptoms.  Chest tube output 59 cc.  Objective: Vitals:   07/15/19 1932 07/15/19 2309 07/16/19 0509 07/16/19 0805  BP: (!) 141/71 114/61 124/71 116/80  Pulse: 75 60 60 60  Resp: 20 20 17 13   Temp: 98.8 F (37.1 C) 98.7 F (37.1 C) 98.4 F (36.9 C) 98.2 F (36.8 C)  TempSrc: Oral Oral Oral Oral  SpO2: 100% 100% 100% 99%  Weight:      Height:        Intake/Output Summary (Last 24 hours) at 07/16/2019 1216 Last data filed at 07/16/2019 0805 Gross per 24 hour  Intake -  Output 1059 ml  Net -1059 ml   Filed Weights   07/11/19 2046  Weight: 68 kg    Examination:  GENERAL: No acute distress.  Appears well.  HEENT: MMM.  Vision and hearing grossly intact.  NECK: Supple.  No apparent JVD.  RESP:  No IWOB.  Fair air movement bilaterally.  Chest tube in left chest. CVS:  RRR. Heart sounds normal.  ABD/GI/GU: Bowel sounds present. Soft. Non tender.  MSK/EXT:  Moves extremities. No apparent deformity or edema.  SKIN: no apparent skin lesion or wound NEURO: Awake, alert and oriented appropriately.  No gross deficit.  PSYCH: Calm. Normal affect.   Assessment & Plan: Spontaneous left-sided pneumothorax: Resolving. -Status post chest tube placement. -CXR today without residual pneumothorax or any other new finding. -PCCM following.  Polysubstance abuse: UDS positive for benzo, opiates and THC  -Counseled to quit.  Tobacco use disorder -Cessation counseling.                 DVT prophylaxis: Subcu Lovenox Code Status: Full code Family Communication: Patient and/or RN. Available if any question. Disposition Plan: Home once chest tube out and cleared by PCCM. Consultants: PCCM  Procedures:  Chest tube placement  Microbiology summarized: COVID-19 negative.  Sch Meds:  Scheduled Meds: . enoxaparin (LOVENOX) injection  40 mg Subcutaneous Q24H  . senna-docusate  1 tablet Oral BID   Continuous Infusions: . sodium chloride Stopped (07/12/19 1141)   PRN Meds:.acetaminophen **OR** acetaminophen, morphine injection, ondansetron **OR** ondansetron (ZOFRAN) IV, polyethylene glycol, traMADol  Antimicrobials: Anti-infectives (From admission, onward)   None       I have personally reviewed the following labs and images: CBC: Recent Labs  Lab 07/11/19 1106 07/11/19 2025 07/12/19 0502 07/16/19 0422  WBC 10.0 11.7* 8.7 7.3  NEUTROABS  --   --   --  4.5  HGB 15.6 16.2 15.6 16.1  HCT 45.7 47.0 44.4 46.1  MCV 88.9 87.2 87.7 86.7  PLT 172 205 186 203   BMP &GFR Recent Labs  Lab 07/11/19 1106 07/11/19 2025 07/12/19 0502 07/16/19 0422  NA 137  --  140 136  K 4.1  --  3.9 4.3  CL 107  --  107 102  CO2 22  --  23 24  GLUCOSE 99  --  87  90  BUN 11  --  12 16  CREATININE 0.90 1.03 0.86 1.06  CALCIUM 9.2  --  9.5 9.8   Estimated Creatinine Clearance: 107.8 mL/min (by C-G formula based on SCr of 1.06 mg/dL). Liver & Pancreas: Recent Labs  Lab 07/12/19 0502  AST 13*  ALT 13  ALKPHOS 55  BILITOT 2.8*  PROT 6.3*  ALBUMIN 4.0   No results for input(s): LIPASE, AMYLASE in the last 168 hours. No results for input(s): AMMONIA in the last 168 hours. Diabetic: No results for input(s): HGBA1C in the last 72 hours. Recent Labs  Lab 07/15/19 0726  GLUCAP 95   Cardiac Enzymes: No results for input(s): CKTOTAL, CKMB, CKMBINDEX, TROPONINI in the last 168  hours. No results for input(s): PROBNP in the last 8760 hours. Coagulation Profile: No results for input(s): INR, PROTIME in the last 168 hours. Thyroid Function Tests: No results for input(s): TSH, T4TOTAL, FREET4, T3FREE, THYROIDAB in the last 72 hours. Lipid Profile: No results for input(s): CHOL, HDL, LDLCALC, TRIG, CHOLHDL, LDLDIRECT in the last 72 hours. Anemia Panel: No results for input(s): VITAMINB12, FOLATE, FERRITIN, TIBC, IRON, RETICCTPCT in the last 72 hours. Urine analysis: No results found for: COLORURINE, APPEARANCEUR, LABSPEC, PHURINE, GLUCOSEU, HGBUR, BILIRUBINUR, KETONESUR, PROTEINUR, UROBILINOGEN, NITRITE, LEUKOCYTESUR Sepsis Labs: Invalid input(s): PROCALCITONIN, LACTICIDVEN  Microbiology: Recent Results (from the past 240 hour(s))  SARS CORONAVIRUS 2 (TAT 6-24 HRS) Nasopharyngeal Nasopharyngeal Swab     Status: None   Collection Time: 07/11/19  1:01 PM   Specimen: Nasopharyngeal Swab  Result Value Ref Range Status   SARS Coronavirus 2 NEGATIVE NEGATIVE Final    Comment: (NOTE) SARS-CoV-2 target nucleic acids are NOT DETECTED. The SARS-CoV-2 RNA is generally detectable in upper and lower respiratory specimens during the acute phase of infection. Negative results do not preclude SARS-CoV-2 infection, do not rule out co-infections with other pathogens, and should not be used as the sole basis for treatment or other patient management decisions. Negative results must be combined with clinical observations, patient history, and epidemiological information. The expected result is Negative. Fact Sheet for Patients: HairSlick.nohttps://www.fda.gov/media/138098/download Fact Sheet for Healthcare Providers: quierodirigir.comhttps://www.fda.gov/media/138095/download This test is not yet approved or cleared by the [place] FDA and  has been authorized for detection and/or diagnosis of SARS-CoV-2 by FDA under an Emergency Use Authorization (EUA). This EUA will remain  in effect (meaning this  test can be used) for the duration of the COVID-19 declaration under Section 56 4(b)(1) of the Act, 21 U.S.C. section 360bbb-3(b)(1), unless the authorization is terminated or revoked sooner. Performed at [facility], [address] [place], [place] [address]   SARS CORONAVIRUS 2 (TAT 6-24 HRS) Nasopharyngeal Nasopharyngeal Swab     Status: None   Collection Time: 07/11/19 11:51 PM   Specimen: Nasopharyngeal Swab  Result Value Ref Range Status   SARS Coronavirus 2 NEGATIVE NEGATIVE Final    Comment: (NOTE) SARS-CoV-2 target nucleic acids are NOT DETECTED. The SARS-CoV-2 RNA is generally detectable in upper and lower respiratory specimens during the acute phase of infection. Negative results do not preclude SARS-CoV-2 infection, do not rule out co-infections with other pathogens, and should not be used as the sole basis for treatment or other patient management decisions. Negative results must be combined with clinical observations, patient history, and epidemiological information. The expected result is Negative. Fact Sheet for Patients: HairSlick.nohttps://www.fda.gov/media/138098/download Fact Sheet for Healthcare Providers: quierodirigir.comhttps://www.fda.gov/media/138095/download This test is not yet approved or cleared by the [place] FDA and  has been authorized for detection and/or diagnosis of SARS-CoV-2 by FDA under an Emergency Use Authorization (EUA). This EUA will remain  in effect (meaning this test can be used) for the duration of the COVID-19 declaration under Section 56 4(b)(1) of the Act, 21 U.S.C. section 360bbb-3(b)(1), unless the authorization is terminated or revoked sooner. Performed at [facility], [place] [address], [place], [place] [address]     Radiology Studies: Dg Chest Port 1 View  Result Date: 07/16/2019 CLINICAL DATA:  Follow-up pneumothorax. EXAM: PORTABLE CHEST 1 VIEW COMPARISON:  Multiple recent chest films. FINDINGS: The left-sided chest tube is  stable. No residual or recurrent pneumothorax. The cardiac silhouette, mediastinal and hilar contours are normal and the lungs are clear. IMPRESSION: Left-sided chest tube in good position without complicating features. No residual pneumothorax. Electronically Signed   By: Marijo Sanes M.D.   On: 07/16/2019 07:48   Dg Chest Port 1 View  Result Date: 07/15/2019 CLINICAL DATA:  18 year old male with history of left-sided chest tube in place. EXAM: PORTABLE CHEST 1 VIEW COMPARISON:  Chest x-ray 07/15/2019. FINDINGS: Left-sided chest tube in position in the lateral aspect of the mid left hemithorax. No appreciable pneumothorax confidently identified on today's examination. Small amount of subcutaneous emphysema in the left chest wall. No acute consolidative airspace disease. No pleural effusions. No suspicious appearing pulmonary nodules or masses. No evidence of pulmonary edema. Heart size is normal. Upper mediastinal contours are within normal limits. IMPRESSION: 1. Left-sided chest tube is stable in position with no residual left-sided pneumothorax confidently identified. Electronically Signed   By: Vinnie Langton M.D.   On: 07/15/2019 16:17     T. [place]  If 7PM-7AM, please contact night-coverage [email or web address] Password [email or web address] 07/16/2019, 12:16 PM

## 2019-07-16 NOTE — Progress Notes (Signed)
NAME:  Sachit Gilman, MRN:  [id number], DOB:  Aug 19, 2001, LOS: 4 ADMISSION DATE:  07/11/2019, CONSULTATION DATE: 07/11/2019 REFERRING MD: ED physician, CHIEF COMPLAINT: Left primary spontaneous pneumothorax  Brief History   18 year old male with hx of Bipolar and tobacco abuse presenting with acute onset of left sided chest pain with SOB.  Found to have moderate size left pneumothorax.  Pigtail catheter placed in ER with resolution of PTX.  PCCM consulted for CT management.  Attempted to transition to waterseal 10/25 with increase in PTX requiring placement of suction.    Past Medical History  Bipolar type 2 and tobacco abuse  - adopted but biological father is tall.  Works in Biomedical scientist with routine heaving lifting   [facility] Events   10/24-chest tube placed for pneumothorax  Consults:  Pulmonary consult 07/11/2019  Procedures:  Chest tube placement for pneumothorax 07/11/2019  Significant Diagnostic Tests:  10/24 CXR- mod to larg left PTX 10/24 CXR- s/p pigtail CT placement with small residual left apicolateral PTA  10/25 CXR - slightly larger left PTX, no tension component 10/26 CXR - persistent small to mod left PTX- 10% unchanged  10/29 CXR no residual PTX  Micro Data:  10/24 SARS CoV2 >> negative x 2  Antimicrobials:  None  Interim history/subjective:  Feels good today. Only discomfort is at CT insertion site.   Objective   Blood pressure 116/80, pulse 60, temperature 98.2 F (36.8 C), temperature source Oral, resp. rate 16, height 6\' 2"  (1.88 m), weight 68 kg, SpO2 99 %.        Intake/Output Summary (Last 24 hours) at 07/16/2019 1325 Last data filed at 07/16/2019 0805 Gross per 24 hour  Intake -  Output 1059 ml  Net -1059 ml   Filed Weights   07/11/19 2046  Weight: 68 kg   Examination: General: Young, adult male. Well-developed, well nourished. NAD HENT: Normocephalic, PERRL. Moist mucus membranes Neck: No JVD. Trachea midline. No  thyromegaly, no lymphadenopathy CV: RRR. S1S2. No MRG. +2 distal pulses Lungs: BBS present, clear, FNL, symmetrical.  CT to -20cm H2O, no air leak ABD: +BS x4. SNT/ND. No masses, guarding or rigidity GU: No Foley EXT: MAE well. No edema Skin: PWD. In tact. No rashes or lesions Neuro: A&Ox3. CN II-XII in tact. No focal deficits Psych: Appropriate mood, insight and judgment for time and situation     Resolved Hospital Problem list     Assessment & Plan:  Left-sided spontaneous pneumothorax: several risk factors- tall/thin, smoker, physically demanding job/ lifting heavy things, and UDS + THC/ benzo/ opiates (of note UDS collected the day after he received morphine and versed in ED)  10/27- symptoms imaging improved today on 40cm H2O suction.  10/28 - near resolution of PTX on 10/28 AM 10/29 resolution of PTX on CXR   P:  CT placed to water seal  Repeat CXR in the afternoon to ensure no progression of PTX If remains stable tomorrow morning will d/c CT  Flush catheter per guidelines.  Ongoing tele/ pulse-ox monitoring  PCCM will follow.   Best practice:  Diet: Regular Pain/Anxiety/Delirium protocol (if indicated): As needed morphine DVT prophylaxis: Lovenox Mobility: Bedrest Code Status: Full code Disposition: tele   Labs   CBC: Recent Labs  Lab 07/11/19 1106 07/11/19 2025 07/12/19 0502 07/16/19 0422  WBC 10.0 11.7* 8.7 7.3  NEUTROABS  --   --   --  4.5  HGB 15.6 16.2 15.6 16.1  HCT 45.7 47.0 44.4 46.1  MCV  88.9 87.2 87.7 86.7  PLT 172 205 186 203    Basic Metabolic Panel: Recent Labs  Lab 07/11/19 1106 07/11/19 2025 07/12/19 0502 07/16/19 0422  NA 137  --  140 136  K 4.1  --  3.9 4.3  CL 107  --  107 102  CO2 22  --  23 24  GLUCOSE 99  --  87 90  BUN 11  --  12 16  CREATININE 0.90 1.03 0.86 1.06  CALCIUM 9.2  --  9.5 9.8    Karin Lieu, MSN, AGACNP  [facility]

## 2019-07-17 ENCOUNTER — Inpatient Hospital Stay (HOSPITAL_COMMUNITY): Payer: 59

## 2019-07-17 ENCOUNTER — Telehealth: Payer: Self-pay

## 2019-07-17 DIAGNOSIS — F1990 Other psychoactive substance use, unspecified, uncomplicated: Secondary | ICD-10-CM

## 2019-07-17 DIAGNOSIS — F17200 Nicotine dependence, unspecified, uncomplicated: Secondary | ICD-10-CM | POA: Diagnosis not present

## 2019-07-17 DIAGNOSIS — J9311 Primary spontaneous pneumothorax: Secondary | ICD-10-CM | POA: Diagnosis not present

## 2019-07-17 DIAGNOSIS — Z720 Tobacco use: Secondary | ICD-10-CM

## 2019-07-17 NOTE — Progress Notes (Signed)
NAME:  Vernel Langenderfer, MRN:  [id number], DOB:  2001-05-27, LOS: 5 ADMISSION DATE:  07/11/2019, CONSULTATION DATE: 07/11/2019 REFERRING MD: ED physician, CHIEF COMPLAINT: Left primary spontaneous pneumothorax  Brief History   18 year old male with hx of Bipolar and tobacco abuse presenting with acute onset of left sided chest pain with SOB.  Found to have moderate size left pneumothorax.  Pigtail catheter placed in ER with resolution of PTX.  PCCM consulted for CT management.  Attempted to transition to waterseal 10/25 with increase in PTX requiring placement of suction.    Past Medical History  Bipolar type 2 and tobacco abuse  - adopted but biological father is tall.  Works in Aeronautical engineer with routine heaving lifting   Significant Hospital Events   10/24-chest tube placed for pneumothorax  Consults:  Pulmonary consult 07/11/2019  Procedures:  Chest tube placement for pneumothorax 07/11/2019  Significant Diagnostic Tests:  10/24 CXR- mod to larg left PTX 10/24 CXR- s/p pigtail CT placement with small residual left apicolateral PTA  10/25 CXR - slightly larger left PTX, no tension component 10/26 CXR - persistent small to mod left PTX- 10% unchanged  10/29 CXR no residual PTX  Micro Data:  10/24 SARS CoV2 >> negative x 2  Antimicrobials:  None  Interim history/subjective:  He feels like walking around. No CP or SOB. Some itching and pain at insertion site.  Objective   Blood pressure (!) 125/56, pulse (!) 108, temperature 97.8 F (36.6 C), temperature source Oral, resp. rate 19, height 6\' 2"  (1.88 m), weight 68 kg, SpO2 98 %.       No intake or output data in the 24 hours ending 07/17/19 1539 Filed Weights   07/11/19 2046  Weight: 68 kg   Examination: General: Young man standing up and walking around, no acute distress. HENT: [place]/AT, eyes anicteric Neck: No JVD CV: Regular rate and rhythm, no murmurs Lungs: Bilateral metric breath sounds, CTAB. No air leak or  tidaling from chest tube. ABD: Soft, nondistended EXT: No edema or cyanosis Skin: No rashes or lesions, no significant erythema surrounding chest tube site Neuro: Alert and oriented, normal gait, face symmetric.  Answering questions appropriately with normal speech.  No focal deficits. Psych: Cooperative, interactive    Resolved Hospital Problem list     Assessment & Plan:  Left-sided primary spontaneous pneumothorax: several risk factors- tall/thin, smoker, physically demanding job/ lifting heavy things, and UDS + THC.  10/27- symptoms imaging improved today on 40cm H2O suction.  10/28 - near resolution of PTX on 10/28 AM 10/29 resolution of PTX on CXR  10/30 tube removed P:  -Given stability of apical pneumothorax that is not communicating with his lateral chest tube, chest tube was discontinued. -Repeat CXR to demonstrate stability -We will plan to discharge home if x-ray and clinical exam are stable.  If communicated with Dr. 11/30. -Occlusive dressing in place which should remain in place for the next 24 to 48 hours.  Okay to shower, but may not submerge in a tub or pool. -He should be supervised overnight-you will be with his mother.  We have discussed strict ER return precautions including if he develops chest pain, shortness of breath, or other concerning symptoms. -No scuba diving or flying for the next month. -Strongly recommended against smoking and especially inhalational drugs such as marijuana, which place him at increased risk for bullous lung disease and future pneumothoraces. -We will follow up with me in clinic in the next 2 to 4  weeks (I have requested an appt, and they will contact him)  Best practice:  Diet: Regular Pain/Anxiety/Delirium protocol (if indicated): n/a DVT prophylaxis: Lovenox Mobility: Bedrest Code Status: Full code Disposition: tele   Labs   CBC: Recent Labs  Lab 07/11/19 1106 07/11/19 2025 07/12/19 0502 07/16/19 0422  WBC 10.0 11.7*  8.7 7.3  NEUTROABS  --   --   --  4.5  HGB 15.6 16.2 15.6 16.1  HCT 45.7 47.0 44.4 46.1  MCV 88.9 87.2 87.7 86.7  PLT 172 205 186 532    Basic Metabolic Panel: Recent Labs  Lab 07/11/19 1106 07/11/19 2025 07/12/19 0502 07/16/19 0422  NA 137  --  140 136  K 4.1  --  3.9 4.3  CL 107  --  107 102  CO2 22  --  23 24  GLUCOSE 99  --  87 90  BUN 11  --  12 16  CREATININE 0.90 1.03 0.86 1.06  CALCIUM 9.2  --  9.5 9.8   Julian Hy, DO 07/17/19 3:49 PM [facility]

## 2019-07-17 NOTE — Discharge Summary (Signed)
Physician Discharge Summary  Tim Flores RUE:[id number] DOB: 2001/04/21 DOA: 07/11/2019  PCP: Patient, No Pcp Per  Admit date: 07/11/2019 Discharge date: 07/17/2019  Admitted From: Home Disposition: Home  Recommendations for Outpatient Follow-up:  1. Follow up with PCP in 1-2 weeks 2. Please follow up on the following pending results: None  Home Health: None Equipment/Devices: None  Discharge Condition: Stable CODE STATUS: Full code  Hospital Course: 18 year old male with history of bipolar disorder presented on 07/11/2019 with left-sided chest pain and palpitations. He was found to have abnormal EKG with diffuse ST elevation along with moderate to large left-sided pneumothorax. Pulmonary was consulted.Pigtail chest tube was inserted  on 10/24.   CT chest on 07/16/2019 with tiny residual left apical pneumothorax measuring 7 mm in maximum thickness but no other significant finding.  Chest tube removed on 07/17/2019.  Chest x-ray after chest tube removal without of pneumothorax or other finding.  Patient was advised to be around someone at least overnight in case of recurrence.  Patient and patient's mother were given return precautions including new chest pain, shortness of breath or other symptoms concerning to him.  He was advised to keep the site bandaged for 24 to 28 hours.  Pulmonology to arrange outpatient follow-up.   Patient was counseled on substance use and tobacco use cessation.  Encouraged to follow-up with PCP or establish one if he does not have one already.   See individual problem list below for more hospital course.  Discharge Diagnoses:  Spontaneous left-sided pneumothorax: Resolved. -Chest tube placement 10/24-10/30.  -Ibuprofen as needed for pain. -Pulmonology to arrange outpatient follow-up  Polysubstance abuse: UDS positive for benzo, opiates and THC -Counseled to quit.  Tobacco use disorder -Cessation counseling.  Discharge  Instructions  Discharge Instructions    Diet general   Complete by: As directed    Discharge instructions   Complete by: As directed    It has been a pleasure taking care of you! You were hospitalized with chest pain palpitation due to pneumothorax.  This was treated surgically by chest tube placement and your pneumothorax resolved.  You should be around someone at least overnight tonight in case you you have recurrence. Keep the site bandaged for 24 to 28 hrs. Please, return to the emergency department right away if you have no chest pain, trouble breathing or other symptoms concerning to you. Pulmonologist will arrange your outpatient follow-up in about 2 weeks.   Take care,   Increase activity slowly   Complete by: As directed      Allergies as of 07/17/2019   No Known Allergies     Medication List    TAKE these medications   ibuprofen 600 MG tablet Commonly known as: ADVIL Take 1 tablet (600 mg total) by mouth every 6 (six) hours as needed.       Consultations:  PCCM  Procedures/Studies  Left chest tube placement on 07/11/2019  Dg Chest 2 View  Result Date: 07/11/2019 CLINICAL DATA:  Left-sided chest pain and shortness of breath. EXAM: CHEST - 2 VIEW COMPARISON:  None. FINDINGS: The heart size and mediastinal contours are within normal limits. Normal pulmonary vascularity. Moderate to large left pneumothorax. No significant mediastinal shift. No consolidation or pleural effusion. No acute osseous abnormality. IMPRESSION: 1. Moderate to large left pneumothorax. Critical Value/emergent results were called by telephone at the time of interpretation on 07/11/2019 at 12:49 pm to providerANKIT NANAVATI , who verbally acknowledged these results. Electronically Signed   By: Obie Dredge  M.D.   On: 07/11/2019 12:51   Ct Chest Wo Contrast  Result Date: 07/16/2019 CLINICAL DATA:  Follow-up spontaneous pneumothorax. EXAM: CT CHEST WITHOUT CONTRAST TECHNIQUE: Multidetector CT  imaging of the chest was performed following the standard protocol without IV contrast. COMPARISON:  Chest radiograph from earlier today. FINDINGS: Cardiovascular: No significant vascular findings. Normal heart size. No pericardial effusion. Mediastinum/Nodes: No enlarged mediastinal or axillary lymph nodes. Thyroid gland, trachea, and esophagus demonstrate no significant findings. Lungs/Pleura: There is a left-sided pigtail catheter within the left pleural space overlying the lingula. Subcutaneous gas adjacent to the catheter noted within the left lateral chest wall. Tiny left apical pneumothorax remains measuring 7 mm in maximum thickness, image 51/6. No airspace consolidation, or atelectasis. No pleural effusion. Upper Abdomen: No acute abnormality. Musculoskeletal: No chest wall mass or suspicious bone lesions identified. IMPRESSION: Left-sided chest tube in place with tiny residual left apical pneumothorax measuring 7 mm in maximum thickness. Electronically Signed   By: Signa Kell M.D.   On: 07/16/2019 16:15   Dg Chest Port 1 View  Result Date: 07/17/2019 CLINICAL DATA:  Status post chest tube placement for a left pneumothorax. EXAM: PORTABLE CHEST 1 VIEW COMPARISON:  Yesterday. FINDINGS: Normal sized heart. Clear lungs. A small caliber left chest tube remains in place. Probable less than 5% left apical pneumothorax with little change since the CT obtained yesterday. Tiny amount of subcutaneous air adjacent to the chest tube, without significant change. Unremarkable bones. IMPRESSION: Stable probable less than 5% left apical pneumothorax with small caliber left chest tube in place. Electronically Signed   By: Beckie Salts M.D.   On: 07/17/2019 07:07   Dg Chest Port 1 View  Result Date: 07/16/2019 CLINICAL DATA:  Follow-up pneumothorax. EXAM: PORTABLE CHEST 1 VIEW COMPARISON:  Multiple recent chest films. FINDINGS: The left-sided chest tube is stable. No residual or recurrent pneumothorax. The  cardiac silhouette, mediastinal and hilar contours are normal and the lungs are clear. IMPRESSION: Left-sided chest tube in good position without complicating features. No residual pneumothorax. Electronically Signed   By: Rudie Meyer M.D.   On: 07/16/2019 07:48   Dg Chest Port 1 View  Result Date: 07/15/2019 CLINICAL DATA:  18 year old male with history of left-sided chest tube in place. EXAM: PORTABLE CHEST 1 VIEW COMPARISON:  Chest x-ray 07/15/2019. FINDINGS: Left-sided chest tube in position in the lateral aspect of the mid left hemithorax. No appreciable pneumothorax confidently identified on today's examination. Small amount of subcutaneous emphysema in the left chest wall. No acute consolidative airspace disease. No pleural effusions. No suspicious appearing pulmonary nodules or masses. No evidence of pulmonary edema. Heart size is normal. Upper mediastinal contours are within normal limits. IMPRESSION: 1. Left-sided chest tube is stable in position with no residual left-sided pneumothorax confidently identified. Electronically Signed   By: Trudie Reed M.D.   On: 07/15/2019 16:17   Dg Chest Port 1 View  Result Date: 07/15/2019 CLINICAL DATA:  Left chest tube placement for pneumothorax EXAM: PORTABLE CHEST 1 VIEW COMPARISON:  07/14/2019 FINDINGS: The heart size and mediastinal contours are within normal limits. Left lateral pigtail chest tube remains in place. Essential resolution of left-sided pneumothorax with a potential tiny amount of air remaining over the left apex and adjacent to the left heart border. The visualized skeletal structures are unremarkable. IMPRESSION: Essential resolution of left-sided pneumothorax with potential tiny amount of air remaining over the left apex and adjacent to the left heart border. Electronically Signed   By: Sherrine Maples  Kathlene Cote M.D.   On: 07/15/2019 08:35   Dg Chest Port 1 View  Result Date: 07/14/2019 CLINICAL DATA:  Pneumothorax and chest tube in  place. EXAM: PORTABLE CHEST 1 VIEW COMPARISON:  07/13/2019 FINDINGS: Pigtail chest tube is again noted along the lateral aspect of the left chest. There continues to be a small left apical pneumothorax that has slightly decreased in size. Lungs remain clear. Trachea is midline. Heart and mediastinum are within normal limits. Again noted is a small amount of subcutaneous gas in the left chest. IMPRESSION: Left apical pneumothorax has slightly decreased in size. Stable position of the left chest tube. Electronically Signed   By: Markus Daft M.D.   On: 07/14/2019 08:22   Dg Chest Port 1 View  Result Date: 07/13/2019 CLINICAL DATA:  18 year old male with history of pneumothorax. Chest tube. EXAM: PORTABLE CHEST 1 VIEW COMPARISON:  Chest x-ray 07/12/2019. FINDINGS: Previously noted left-sided chest tube remains in stable position with tip in the lateral aspect of the left hemithorax. Small to moderate left pneumothorax occupying approximately 10% of the volume of the left hemithorax is similar to the prior study. No consolidative airspace disease. No pleural effusions. No suspicious appearing pulmonary nodules or masses are noted. No evidence of pulmonary edema. Heart size is normal. Upper mediastinal contours are within normal limits. IMPRESSION: 1. Persistent small to moderate left pneumothorax occupying 10% of the volume of the left hemithorax with unchanged position of left-sided chest tube, as above. Electronically Signed   By: Vinnie Langton M.D.   On: 07/13/2019 08:09   Dg Chest Port 1 View  Result Date: 07/12/2019 CLINICAL DATA:  Left-sided pneumothorax EXAM: PORTABLE CHEST 1 VIEW COMPARISON:  July 11, 2019 FINDINGS: Chest tube remains on the left laterally. Left apical pneumothorax is slightly larger without tension component. There is less soft tissue air laterally on the left compared to 1 day prior. Elsewhere lungs are clear. Heart size and pulmonary vascularity are normal. No adenopathy. No  bone lesions. IMPRESSION: Left a pickle pneumothorax slightly larger compared to 1 day prior with left chest tube again noted present. No tension component. No edema or consolidation. Cardiac silhouette within normal limits. Electronically Signed   By: Lowella Grip III M.D.   On: 07/12/2019 08:51   Dg Chest Port 1 View  Result Date: 07/11/2019 CLINICAL DATA:  Chest tube placement for pneumothorax EXAM: PORTABLE CHEST 1 VIEW COMPARISON:  July 11, 2019 study obtained earlier in the day FINDINGS: A chest tube is been placed on the left laterally. There has been near complete resolution of pneumothorax on the left with only small a pickle and apicolateral component of pneumothorax present currently. No tension component. There is subcutaneous air on the left laterally. Lungs elsewhere are clear. Heart size and pulmonary vascularity are normal. No adenopathy. No bone lesions. IMPRESSION: Chest tube placed on the left with near complete resolution of pneumothorax. Small a pickle and apicolateral pneumothorax on the left remains. There is subcutaneous air on the left laterally near the chest tube. No edema or consolidation. Cardiac silhouette within normal limits. No adenopathy. Electronically Signed   By: Lowella Grip III M.D.   On: 07/11/2019 14:39       Discharge Exam: Vitals:   07/17/19 0800 07/17/19 1137  BP: (!) 122/59 (!) 125/56  Pulse: 66 (!) 108  Resp: 18 19  Temp:  97.8 F (36.6 C)  SpO2: 99% 98%    GENERAL: No acute distress.  Appears well.  HEENT: MMM.  Vision and hearing grossly intact.  NECK: Supple.  No apparent JVD.  RESP:  No IWOB. Good air movement bilaterally. CVS:  RRR. Heart sounds normal.  ABD/GI/GU: Bowel sounds present. Soft. Non tender.  MSK/EXT:  Moves extremities. No apparent deformity or edema.  SKIN: no apparent skin lesion or wound NEURO: Awake, alert and oriented appropriately.  No gross deficit.  PSYCH: Calm. Normal affect.   The results of  significant diagnostics from this hospitalization (including imaging, microbiology, ancillary and laboratory) are listed below for reference.     Microbiology: Recent Results (from the past 240 hour(s))  SARS CORONAVIRUS 2 (TAT 6-24 HRS) Nasopharyngeal Nasopharyngeal Swab     Status: None   Collection Time: 07/11/19  1:01 PM   Specimen: Nasopharyngeal Swab  Result Value Ref Range Status   SARS Coronavirus 2 NEGATIVE NEGATIVE Final    Comment: (NOTE) SARS-CoV-2 target nucleic acids are NOT DETECTED. The SARS-CoV-2 RNA is generally detectable in upper and lower respiratory specimens during the acute phase of infection. Negative results do not preclude SARS-CoV-2 infection, do not rule out co-infections with other pathogens, and should not be used as the sole basis for treatment or other patient management decisions. Negative results must be combined with clinical observations, patient history, and epidemiological information. The expected result is Negative. Fact Sheet for Patients: HairSlick.no Fact Sheet for Healthcare Providers: quierodirigir.com This test is not yet approved or cleared by the [place] FDA and  has been authorized for detection and/or diagnosis of SARS-CoV-2 by FDA under an Emergency Use Authorization (EUA). This EUA will remain  in effect (meaning this test can be used) for the duration of the COVID-19 declaration under Section 56 4(b)(1) of the Act, 21 U.S.C. section 360bbb-3(b)(1), unless the authorization is terminated or revoked sooner. Performed at [facility], [address] [place], [place] [address]   SARS CORONAVIRUS 2 (TAT 6-24 HRS) Nasopharyngeal Nasopharyngeal Swab     Status: None   Collection Time: 07/11/19 11:51 PM   Specimen: Nasopharyngeal Swab  Result Value Ref Range Status   SARS Coronavirus 2 NEGATIVE NEGATIVE Final    Comment: (NOTE) SARS-CoV-2 target nucleic acids are  NOT DETECTED. The SARS-CoV-2 RNA is generally detectable in upper and lower respiratory specimens during the acute phase of infection. Negative results do not preclude SARS-CoV-2 infection, do not rule out co-infections with other pathogens, and should not be used as the sole basis for treatment or other patient management decisions. Negative results must be combined with clinical observations, patient history, and epidemiological information. The expected result is Negative. Fact Sheet for Patients: HairSlick.no Fact Sheet for Healthcare Providers: quierodirigir.com This test is not yet approved or cleared by the [place] FDA and  has been authorized for detection and/or diagnosis of SARS-CoV-2 by FDA under an Emergency Use Authorization (EUA). This EUA will remain  in effect (meaning this test can be used) for the duration of the COVID-19 declaration under Section 56 4(b)(1) of the Act, 21 U.S.C. section 360bbb-3(b)(1), unless the authorization is terminated or revoked sooner. Performed at [facility], [address] [place], [place] [address]      Labs: BNP (last 3 results) No results for input(s): BNP in the last 8760 hours. Basic Metabolic Panel: Recent Labs  Lab 07/11/19 1106 07/11/19 2025 07/12/19 0502 07/16/19 0422  NA 137  --  140 136  K 4.1  --  3.9 4.3  CL 107  --  107 102  CO2 22  --  23 24  GLUCOSE 99  --  87 90  BUN 11  --  12 16  CREATININE 0.90 1.03 0.86 1.06  CALCIUM 9.2  --  9.5 9.8   Liver Function Tests: Recent Labs  Lab 07/12/19 0502  AST 13*  ALT 13  ALKPHOS 55  BILITOT 2.8*  PROT 6.3*  ALBUMIN 4.0   No results for input(s): LIPASE, AMYLASE in the last 168 hours. No results for input(s): AMMONIA in the last 168 hours. CBC: Recent Labs  Lab 07/11/19 1106 07/11/19 2025 07/12/19 0502 07/16/19 0422  WBC 10.0 11.7* 8.7 7.3  NEUTROABS  --   --   --  4.5  HGB 15.6 16.2  15.6 16.1  HCT 45.7 47.0 44.4 46.1  MCV 88.9 87.2 87.7 86.7  PLT 172 205 186 203   Cardiac Enzymes: No results for input(s): CKTOTAL, CKMB, CKMBINDEX, TROPONINI in the last 168 hours. BNP: Invalid input(s): POCBNP CBG: Recent Labs  Lab 07/15/19 0726  GLUCAP 95   D-Dimer No results for input(s): DDIMER in the last 72 hours. Hgb A1c No results for input(s): HGBA1C in the last 72 hours. Lipid Profile No results for input(s): CHOL, HDL, LDLCALC, TRIG, CHOLHDL, LDLDIRECT in the last 72 hours. Thyroid function studies No results for input(s): TSH, T4TOTAL, T3FREE, THYROIDAB in the last 72 hours.  Invalid input(s): FREET3 Anemia work up No results for input(s): VITAMINB12, FOLATE, FERRITIN, TIBC, IRON, RETICCTPCT in the last 72 hours. Urinalysis No results found for: COLORURINE, APPEARANCEUR, LABSPEC, PHURINE, GLUCOSEU, HGBUR, BILIRUBINUR, KETONESUR, PROTEINUR, UROBILINOGEN, NITRITE, LEUKOCYTESUR Sepsis Labs Invalid input(s): PROCALCITONIN,  WBC,  LACTICIDVEN   Time coordinating discharge: 25 minutes  SIGNED:  Almon Herculesaye T Donavon Kimrey, MD  [facility] 07/17/2019, 4:04 PM  If 7PM-7AM, please contact night-coverage [email or web address] Password [email or web address]

## 2019-07-17 NOTE — Progress Notes (Signed)
PROGRESS NOTE  Tim Flores RUE:[id number] DOB: 05/19/2001   PCP: Patient, No Pcp Per  Patient is from: Home  DOA: 07/11/2019 LOS: 5  Brief Narrative / Interim history: 18 year old male with history of bipolar disorder presented on 07/11/2019 with left-sided chest pain and palpitations.  He was found to have abnormal EKG with diffuse ST elevation along with moderate to large left-sided pneumothorax. Pulmonary was consulted. Pigtail chest tube was inserted 10/24.    CT chest on 07/16/2019 with tiny residual left apical pneumothorax measuring 7 mm in maximum thickness but no other significant finding.  Subjective: No major events overnight of this morning.  He says he had pain over her chest tube area last night that has improved with pain medication.  Feels better this morning.  Denies chest pain, dyspnea, GI or UTI symptoms.  CXR with minimal residual pneumothorax.  Objective: Vitals:   07/17/19 0002 07/17/19 0432 07/17/19 0500 07/17/19 0800  BP: (!) 119/55 (!) 123/57  (!) 122/59  Pulse: 92 (!) 47 66 66  Resp: 18 11  18   Temp: 98 F (36.7 C) 98.5 F (36.9 C)    TempSrc: Oral Oral    SpO2: 96% 95% 97% 99%  Weight:      Height:       No intake or output data in the 24 hours ending 07/17/19 1028 Filed Weights   07/11/19 2046  Weight: 68 kg    Examination:  GENERAL: No acute distress.  Appears well.  HEENT: MMM.  Vision and hearing grossly intact.  NECK: Supple.  No apparent JVD.  RESP:  No IWOB. Good air movement bilaterally.  Chest tube in left chest. CVS:  RRR. Heart sounds normal.  ABD/GI/GU: Bowel sounds present. Soft. Non tender.  MSK/EXT:  Moves extremities. No apparent deformity or edema.  SKIN: no apparent skin lesion or wound NEURO: Awake, alert and oriented appropriately.  No gross deficit.  PSYCH: Calm. Normal affect.  Assessment & Plan: Spontaneous left-sided pneumothorax: Resolving. -Status post chest tube placement 10/24. -CT chest 10/29 CXR today  with minimal residual pneumothorax. -PCCM following  Polysubstance abuse: UDS positive for benzo, opiates and THC -Counseled to quit.  Tobacco use disorder -Cessation counseling.                 DVT prophylaxis: Subcu Lovenox Code Status: Full code Family Communication: Patient and/or RN. Available if any question. Disposition Plan: Home once chest tube out and cleared by PCCM. Consultants: PCCM  Procedures:  10/29-pigtail chest tube placement by PCCM.  Microbiology summarized: COVID-19 negative.  Sch Meds:  Scheduled Meds:  enoxaparin (LOVENOX) injection  40 mg Subcutaneous Q24H   senna-docusate  1 tablet Oral BID   Continuous Infusions:  sodium chloride Stopped (07/12/19 1141)   PRN Meds:.acetaminophen **OR** acetaminophen, morphine injection, ondansetron **OR** ondansetron (ZOFRAN) IV, polyethylene glycol, traMADol  Antimicrobials: Anti-infectives (From admission, onward)   None       I have personally reviewed the following labs and images: CBC: Recent Labs  Lab 07/11/19 1106 07/11/19 2025 07/12/19 0502 07/16/19 0422  WBC 10.0 11.7* 8.7 7.3  NEUTROABS  --   --   --  4.5  HGB 15.6 16.2 15.6 16.1  HCT 45.7 47.0 44.4 46.1  MCV 88.9 87.2 87.7 86.7  PLT 172 205 186 203   BMP &GFR Recent Labs  Lab 07/11/19 1106 07/11/19 2025 07/12/19 0502 07/16/19 0422  NA 137  --  140 136  K 4.1  --  3.9 4.3  CL 107  --  107 102  CO2 22  --  23 24  GLUCOSE 99  --  87 90  BUN 11  --  12 16  CREATININE 0.90 1.03 0.86 1.06  CALCIUM 9.2  --  9.5 9.8   Estimated Creatinine Clearance: 107.8 mL/min (by C-G formula based on SCr of 1.06 mg/dL). Liver & Pancreas: Recent Labs  Lab 07/12/19 0502  AST 13*  ALT 13  ALKPHOS 55  BILITOT 2.8*  PROT 6.3*  ALBUMIN 4.0   No results for input(s): LIPASE, AMYLASE in the last 168 hours. No results for input(s): AMMONIA in the last 168 hours. Diabetic: No results for input(s): HGBA1C in the last 72  hours. Recent Labs  Lab 07/15/19 0726  GLUCAP 95   Cardiac Enzymes: No results for input(s): CKTOTAL, CKMB, CKMBINDEX, TROPONINI in the last 168 hours. No results for input(s): PROBNP in the last 8760 hours. Coagulation Profile: No results for input(s): INR, PROTIME in the last 168 hours. Thyroid Function Tests: No results for input(s): TSH, T4TOTAL, FREET4, T3FREE, THYROIDAB in the last 72 hours. Lipid Profile: No results for input(s): CHOL, HDL, LDLCALC, TRIG, CHOLHDL, LDLDIRECT in the last 72 hours. Anemia Panel: No results for input(s): VITAMINB12, FOLATE, FERRITIN, TIBC, IRON, RETICCTPCT in the last 72 hours. Urine analysis: No results found for: COLORURINE, APPEARANCEUR, LABSPEC, PHURINE, GLUCOSEU, HGBUR, BILIRUBINUR, KETONESUR, PROTEINUR, UROBILINOGEN, NITRITE, LEUKOCYTESUR Sepsis Labs: Invalid input(s): PROCALCITONIN, [place]  Microbiology: Recent Results (from the past 240 hour(s))  SARS CORONAVIRUS 2 (TAT 6-24 HRS) Nasopharyngeal Nasopharyngeal Swab     Status: None   Collection Time: 07/11/19  1:01 PM   Specimen: Nasopharyngeal Swab  Result Value Ref Range Status   SARS Coronavirus 2 NEGATIVE NEGATIVE Final    Comment: (NOTE) SARS-CoV-2 target nucleic acids are NOT DETECTED. The SARS-CoV-2 RNA is generally detectable in upper and lower respiratory specimens during the acute phase of infection. Negative results do not preclude SARS-CoV-2 infection, do not rule out co-infections with other pathogens, and should not be used as the sole basis for treatment or other patient management decisions. Negative results must be combined with clinical observations, patient history, and epidemiological information. The expected result is Negative. Fact Sheet for Patients: SugarRoll.be Fact Sheet for Healthcare Providers: https://www.woods-mathews.com/ This test is not yet approved or cleared by the [place] FDA and  has been  authorized for detection and/or diagnosis of SARS-CoV-2 by FDA under an Emergency Use Authorization (EUA). This EUA will remain  in effect (meaning this test can be used) for the duration of the COVID-19 declaration under Section 56 4(b)(1) of the Act, 21 U.S.C. section 360bbb-3(b)(1), unless the authorization is terminated or revoked sooner. Performed at [facility], [place] [address] [place], [place] [address]   SARS CORONAVIRUS 2 (TAT 6-24 HRS) Nasopharyngeal Nasopharyngeal Swab     Status: None   Collection Time: 07/11/19 11:51 PM   Specimen: Nasopharyngeal Swab  Result Value Ref Range Status   SARS Coronavirus 2 NEGATIVE NEGATIVE Final    Comment: (NOTE) SARS-CoV-2 target nucleic acids are NOT DETECTED. The SARS-CoV-2 RNA is generally detectable in upper and lower respiratory specimens during the acute phase of infection. Negative results do not preclude SARS-CoV-2 infection, do not rule out co-infections with other pathogens, and should not be used as the sole basis for treatment or other patient management decisions. Negative results must be combined with clinical observations, patient history, and epidemiological information. The expected result is Negative. Fact Sheet for Patients: SugarRoll.be Fact Sheet for Healthcare  Providers: quierodirigir.com This test is not yet approved or cleared by the [place] and  has been authorized for detection and/or diagnosis of SARS-CoV-2 by FDA under an Emergency Use Authorization (EUA). This EUA will remain  in effect (meaning this test can be used) for the duration of the COVID-19 declaration under Section 56 4(b)(1) of the Act, 21 U.S.C. section 360bbb-3(b)(1), unless the authorization is terminated or revoked sooner. Performed at [facility], [address], [place], [place] [address]     Radiology Studies: Ct Chest Wo Contrast  Result Date:  07/16/2019 CLINICAL DATA:  Follow-up spontaneous pneumothorax. EXAM: CT CHEST WITHOUT CONTRAST TECHNIQUE: Multidetector CT imaging of the chest was performed following the standard protocol without IV contrast. COMPARISON:  Chest radiograph from earlier today. FINDINGS: Cardiovascular: No significant vascular findings. Normal heart size. No pericardial effusion. Mediastinum/Nodes: No enlarged mediastinal or axillary lymph nodes. Thyroid gland, trachea, and esophagus demonstrate no significant findings. Lungs/Pleura: There is a left-sided pigtail catheter within the left pleural space overlying the lingula. Subcutaneous gas adjacent to the catheter noted within the left lateral chest wall. Tiny left apical pneumothorax remains measuring 7 mm in maximum thickness, image 51/6. No airspace consolidation, or atelectasis. No pleural effusion. Upper Abdomen: No acute abnormality. Musculoskeletal: No chest wall mass or suspicious bone lesions identified. IMPRESSION: Left-sided chest tube in place with tiny residual left apical pneumothorax measuring 7 mm in maximum thickness. Electronically Signed   By: Signa Kell M.D.   On: 07/16/2019 16:15   Dg Chest Port 1 View  Result Date: 07/17/2019 CLINICAL DATA:  Status post chest tube placement for a left pneumothorax. EXAM: PORTABLE CHEST 1 VIEW COMPARISON:  Yesterday. FINDINGS: Normal sized heart. Clear lungs. A small caliber left chest tube remains in place. Probable less than 5% left apical pneumothorax with little change since the CT obtained yesterday. Tiny amount of subcutaneous air adjacent to the chest tube, without significant change. Unremarkable bones. IMPRESSION: Stable probable less than 5% left apical pneumothorax with small caliber left chest tube in place. Electronically Signed   By: Beckie Salts M.D.   On: 07/17/2019 07:07    Nisha Dhami T. Daeshawn Redmann [facility]  If 7PM-7AM, please contact night-coverage [email or web address] Password [email or web address] 07/17/2019, 10:28  AM

## 2019-07-17 NOTE — Telephone Encounter (Signed)
Left message for patient to call back Monday.  

## 2019-07-17 NOTE — Telephone Encounter (Signed)
-----   Message from Julian Hy, DO sent at 07/17/2019  3:46 PM EDT ----- Regarding: follow up appt 2-4 weeks This gentleman is going to be discharging home today.  Can you set up a follow-up appointment with me in clinic in the next 2 to 4 weeks please?  Thanks!LPC

## 2019-07-21 NOTE — Telephone Encounter (Signed)
Left message for patient to call back  

## 2019-08-03 ENCOUNTER — Telehealth: Payer: Self-pay | Admitting: Critical Care Medicine

## 2019-08-03 NOTE — Telephone Encounter (Signed)
Called patient. Call went straight to VM. Left another VM for patient to call back. Also sent a staff message to Dr. Carlis Abbott to let her know that so far we have been unsuccessful and that I would send a letter to the patient.    Will close this encounter.

## 2020-08-15 IMAGING — DX DG CHEST 1V PORT
1 series · 1 of 1 positions shown · non-contrast
Comparison: 07/14/2019

CLINICAL DATA: Left chest tube placement for pneumothorax

EXAM:
PORTABLE CHEST 1 VIEW

[chest ap]
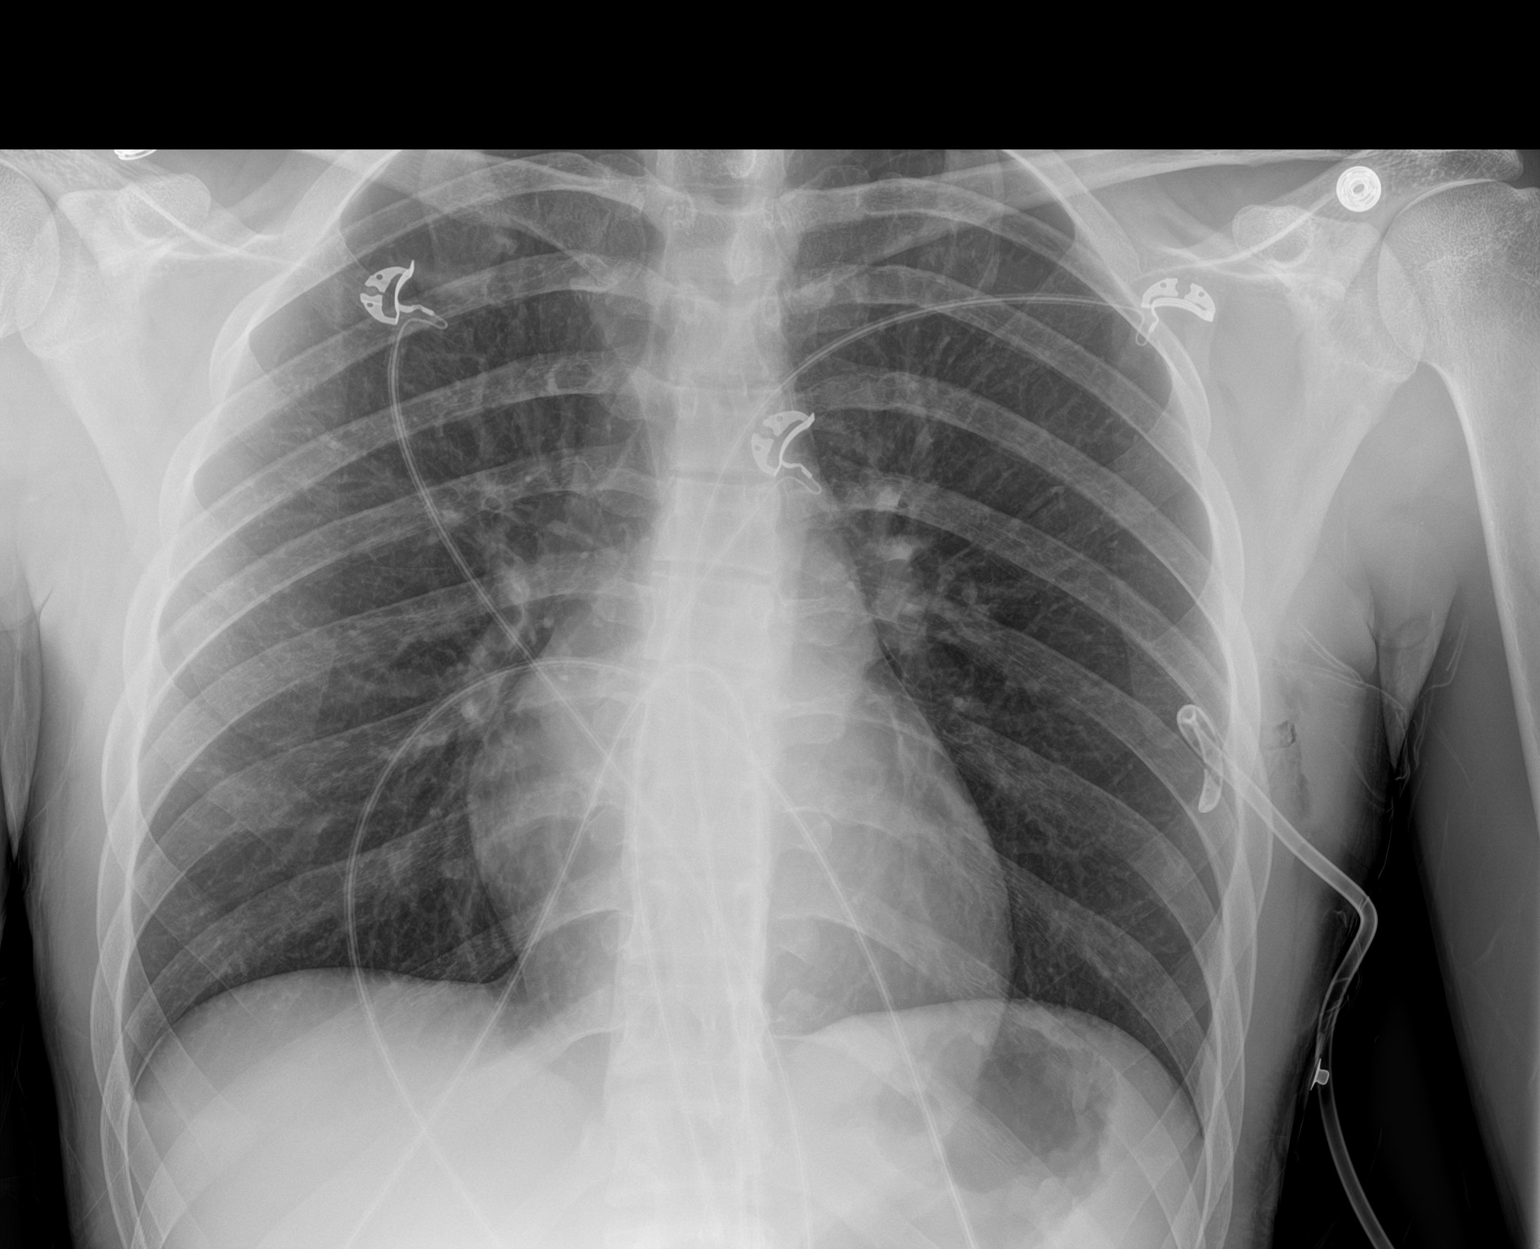

[1 of 1 positions shown; findings below may reference images not displayed]

FINDINGS: The heart size and mediastinal contours are within normal limits.
Left lateral pigtail chest tube remains in place. Essential
resolution of left-sided pneumothorax with a potential tiny amount
of air remaining over the left apex and adjacent to the left heart
border. The visualized skeletal structures are unremarkable.
IMPRESSION: Essential resolution of left-sided pneumothorax with potential tiny
amount of air remaining over the left apex and adjacent to the left
heart border.

## 2020-08-15 IMAGING — DX DG CHEST 1V PORT
1 series · 1 of 1 positions shown · non-contrast
Comparison: Chest x-ray 07/15/2019.

CLINICAL DATA: 18-year-old male with history of left-sided chest
tube in place.

EXAM:
PORTABLE CHEST 1 VIEW

[chest ap]
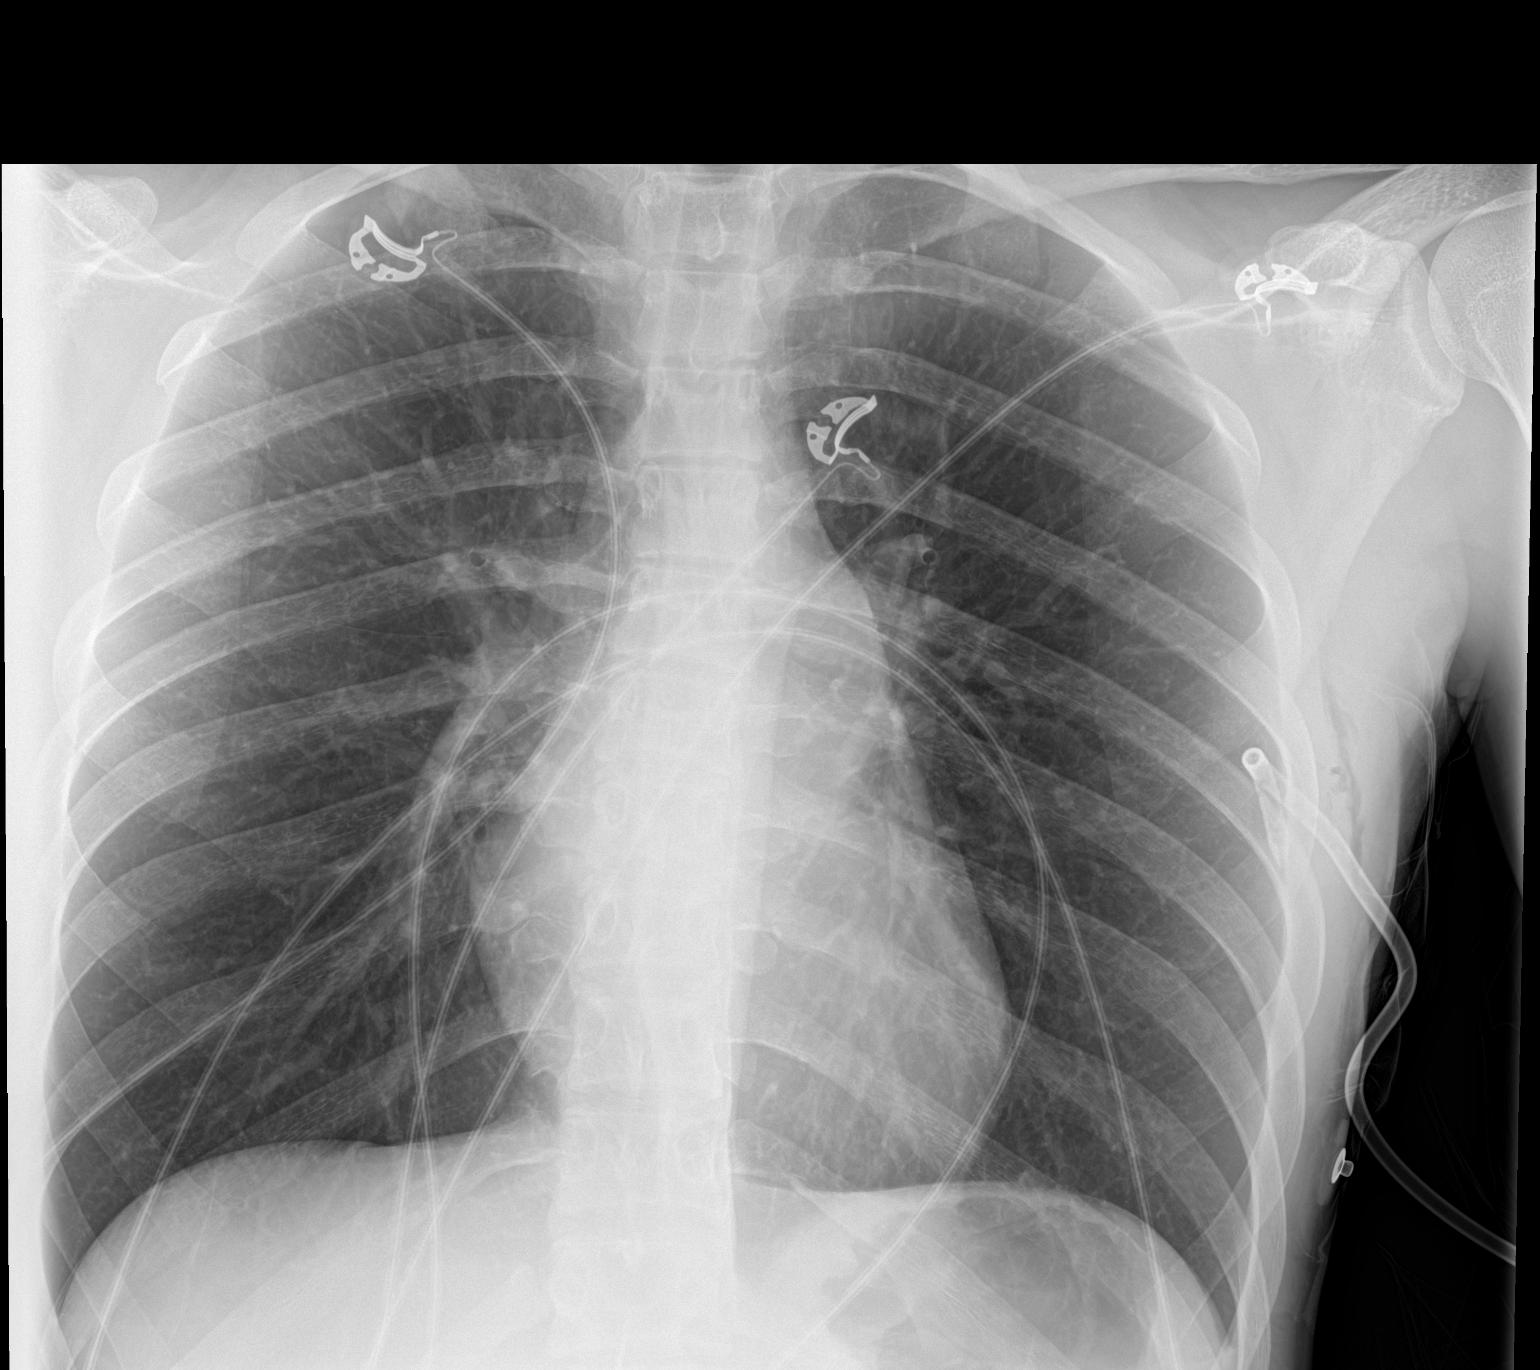

[1 of 1 positions shown; findings below may reference images not displayed]

FINDINGS: Left-sided chest tube in position in the lateral aspect of the mid
left hemithorax. No appreciable pneumothorax confidently identified
on today's examination. Small amount of subcutaneous emphysema in
the left chest wall. No acute consolidative airspace disease. No
pleural effusions. No suspicious appearing pulmonary nodules or
masses. No evidence of pulmonary edema. Heart size is normal. Upper
mediastinal contours are within normal limits.
IMPRESSION: 1. Left-sided chest tube is stable in position with no residual
left-sided pneumothorax confidently identified.

## 2020-08-17 IMAGING — DX DG CHEST 1V PORT
1 series · 1 of 1 positions shown · non-contrast
Comparison: Portable exam 6300 hours compared to 5353 hours

CLINICAL DATA: Pneumothorax

EXAM:
PORTABLE CHEST 1 VIEW

[chest ap]
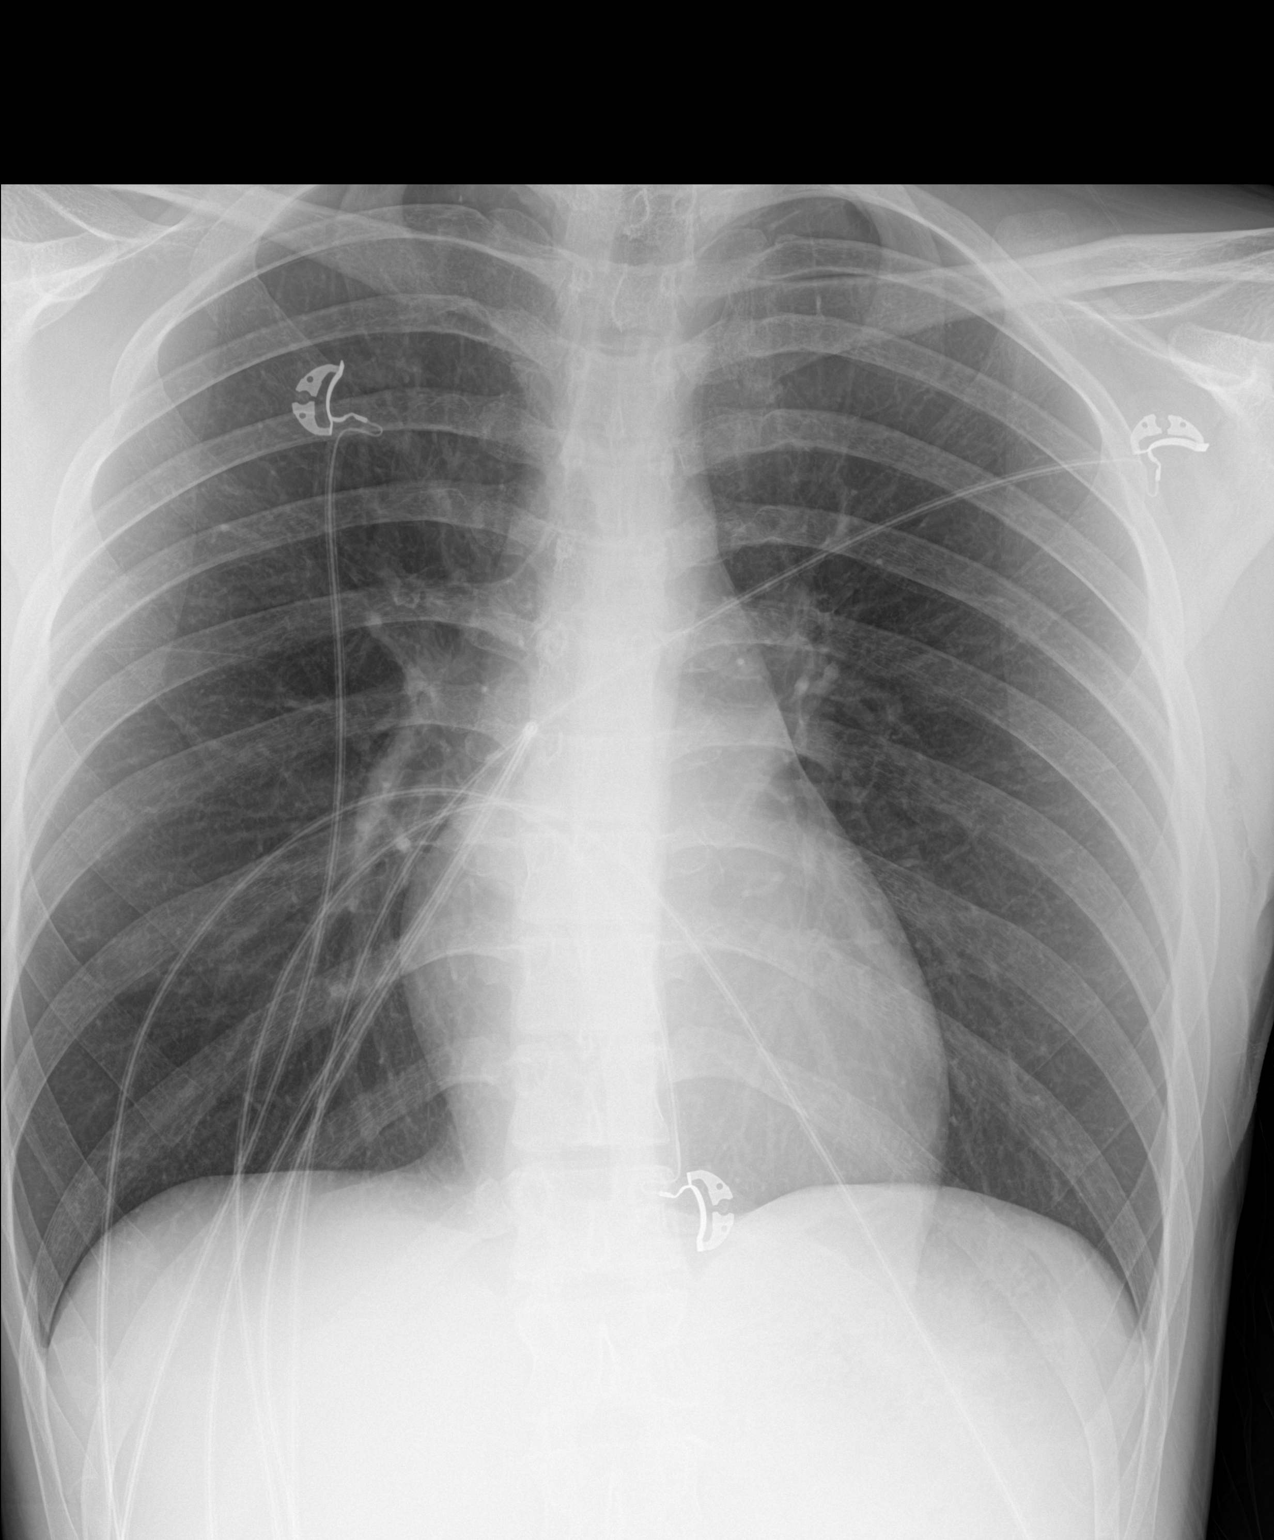

[1 of 1 positions shown; findings below may reference images not displayed]

FINDINGS: Interval removal of pigtail LEFT thoracostomy tube.

Normal heart size, mediastinal contours, and pulmonary vascularity.

Lungs clear.

No pleural effusion or pneumothorax.

Tiny suspected pneumothorax at the LEFT apex on the previous exam no
longer identified.
IMPRESSION: No pneumothorax following chest tube removal.
